# Patient Record
Sex: Female | Born: 1985 | Race: White | Hispanic: No | Marital: Single | Smoking: Former smoker
Health system: Southern US, Community
[De-identification: ages and names within clinical notes are randomized; demographics above are authoritative.]

## PROBLEM LIST (undated history)

## (undated) ENCOUNTER — Inpatient Hospital Stay: Payer: Self-pay

## (undated) DIAGNOSIS — F319 Bipolar disorder, unspecified: Secondary | ICD-10-CM

## (undated) DIAGNOSIS — J45909 Unspecified asthma, uncomplicated: Secondary | ICD-10-CM

## (undated) HISTORY — PX: WISDOM TOOTH EXTRACTION: SHX21

---
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
## 2006-09-08 ENCOUNTER — Emergency Department: Payer: Self-pay | Admitting: Emergency Medicine

## 2006-12-22 ENCOUNTER — Other Ambulatory Visit: Admission: RE | Admit: 2006-12-22 | Discharge: 2006-12-22 | Payer: Self-pay | Admitting: Obstetrics and Gynecology

## 2007-06-27 ENCOUNTER — Inpatient Hospital Stay (HOSPITAL_COMMUNITY): Admission: AD | Admit: 2007-06-27 | Discharge: 2007-06-27 | Payer: Self-pay | Admitting: Obstetrics and Gynecology

## 2007-07-09 ENCOUNTER — Inpatient Hospital Stay (HOSPITAL_COMMUNITY): Admission: AD | Admit: 2007-07-09 | Discharge: 2007-07-09 | Payer: Self-pay | Admitting: Obstetrics and Gynecology

## 2007-07-11 ENCOUNTER — Inpatient Hospital Stay (HOSPITAL_COMMUNITY): Admission: AD | Admit: 2007-07-11 | Discharge: 2007-07-14 | Payer: Self-pay | Admitting: Obstetrics and Gynecology

## 2008-02-26 ENCOUNTER — Other Ambulatory Visit: Admission: RE | Admit: 2008-02-26 | Discharge: 2008-02-26 | Payer: Self-pay | Admitting: Obstetrics and Gynecology

## 2009-06-11 ENCOUNTER — Emergency Department (HOSPITAL_BASED_OUTPATIENT_CLINIC_OR_DEPARTMENT_OTHER): Admission: EM | Admit: 2009-06-11 | Discharge: 2009-06-11 | Payer: Self-pay | Admitting: Emergency Medicine

## 2009-06-11 ENCOUNTER — Ambulatory Visit: Payer: Self-pay | Admitting: Radiology

## 2009-09-25 ENCOUNTER — Other Ambulatory Visit: Admission: RE | Admit: 2009-09-25 | Discharge: 2009-09-25 | Payer: Self-pay | Admitting: Obstetrics and Gynecology

## 2010-10-13 LAB — LIPASE, BLOOD: Lipase: 28 U/L (ref 23–300)

## 2010-10-13 LAB — COMPREHENSIVE METABOLIC PANEL
ALT: 22 U/L (ref 0–35)
Alkaline Phosphatase: 88 U/L (ref 39–117)
BUN: 7 mg/dL (ref 6–23)
CO2: 26 mEq/L (ref 19–32)
Chloride: 105 mEq/L (ref 96–112)
GFR calc non Af Amer: >60 mL/min (ref >60)
Glucose, Bld: 101 mg/dL — ABNORMAL HIGH (ref 70–99)
Potassium: 4 mEq/L (ref 3.5–5.1)
Sodium: 145 mEq/L (ref 135–145)
Total Bilirubin: 1.1 mg/dL (ref 0.3–1.2)
Total Protein: 7.9 g/dL (ref 6.0–8.3)

## 2010-10-13 LAB — URINALYSIS, ROUTINE W REFLEX MICROSCOPIC
Bilirubin Urine: NEGATIVE
Glucose, UA: NEGATIVE mg/dL
Hgb urine dipstick: NEGATIVE
Ketones, ur: NEGATIVE mg/dL
Nitrite: NEGATIVE
Protein, ur: NEGATIVE mg/dL
Specific Gravity, Urine: 1.018 (ref 1.005–1.030)
Urobilinogen, UA: 0.2 mg/dL (ref 0.0–1.0)
pH: 7 (ref 5.0–8.0)

## 2010-10-13 LAB — DIFFERENTIAL
Basophils Absolute: 0 10*3/uL (ref 0.0–0.1)
Basophils Relative: 1 % (ref 0–1)
Eosinophils Absolute: 0.2 10*3/uL (ref 0.0–0.7)
Neutro Abs: 6.8 10*3/uL (ref 1.7–7.7)
Neutrophils Relative %: 75 % (ref 43–77)

## 2010-10-13 LAB — CBC
HCT: 37.5 % (ref 36.0–46.0)
Hemoglobin: 12.7 g/dL (ref 12.0–15.0)
RBC: 4.42 MIL/uL (ref 3.87–5.11)
RDW: 13 % (ref 11.5–15.5)

## 2010-10-13 LAB — GC/CHLAMYDIA PROBE AMP, GENITAL
Chlamydia, DNA Probe: POSITIVE — AB
GC Probe Amp, Genital: NEGATIVE

## 2010-10-13 LAB — URINE MICROSCOPIC-ADD ON

## 2010-10-13 LAB — PREGNANCY, URINE: Preg Test, Ur: NEGATIVE

## 2010-10-13 LAB — WET PREP, GENITAL: Clue Cells Wet Prep HPF POC: NONE SEEN

## 2011-04-01 LAB — BASIC METABOLIC PANEL
BUN: <1 — ABNORMAL LOW
Calcium: 8 — ABNORMAL LOW
Chloride: 102
Chloride: 104
Creatinine, Ser: 0.44
Creatinine, Ser: 0.54
GFR calc Af Amer: >60
GFR calc Af Amer: >60
GFR calc non Af Amer: >60
Potassium: 3.1 — ABNORMAL LOW
Sodium: 138

## 2011-04-01 LAB — RH IMMUNE GLOB WKUP(>/=20WKS)(NOT WOMEN'S HOSP): Fetal Screen: NEGATIVE

## 2011-04-01 LAB — CBC
HCT: 26.9 — ABNORMAL LOW
Hemoglobin: 9.4 — ABNORMAL LOW
MCHC: 35
RBC: 3.04 — ABNORMAL LOW
RDW: 13.9

## 2011-04-16 LAB — CBC
HCT: 31.2 — ABNORMAL LOW
Hemoglobin: 10.8 — ABNORMAL LOW
MCHC: 35
MCV: 88.8
Platelets: 262
RDW: 13.4
RDW: 14
WBC: 11.3 — ABNORMAL HIGH

## 2011-04-16 LAB — LACTATE DEHYDROGENASE: LDH: 163

## 2011-04-16 LAB — URINALYSIS, ROUTINE W REFLEX MICROSCOPIC
Nitrite: NEGATIVE
Protein, ur: NEGATIVE
Specific Gravity, Urine: 1.01
Urobilinogen, UA: 0.2

## 2011-04-16 LAB — COMPREHENSIVE METABOLIC PANEL
ALT: 14
Albumin: 2.5 — ABNORMAL LOW
Albumin: 2.5 — ABNORMAL LOW
Alkaline Phosphatase: 153 — ABNORMAL HIGH
BUN: 1 — ABNORMAL LOW
Calcium: 9.1
Chloride: 104
Chloride: 105
Creatinine, Ser: 0.45
GFR calc Af Amer: >60
Glucose, Bld: 98
Potassium: 2.7 — CL
Sodium: 138
Total Bilirubin: 0.9
Total Protein: 5.4 — ABNORMAL LOW
Total Protein: 5.4 — ABNORMAL LOW

## 2011-04-16 LAB — URINE MICROSCOPIC-ADD ON

## 2011-04-16 LAB — URINALYSIS, DIPSTICK ONLY
Bilirubin Urine: NEGATIVE
Glucose, UA: NEGATIVE
Ketones, ur: NEGATIVE
pH: 7

## 2011-04-16 LAB — RPR: RPR Ser Ql: NONREACTIVE

## 2011-04-16 LAB — URIC ACID: Uric Acid, Serum: 3.3

## 2012-04-17 ENCOUNTER — Other Ambulatory Visit: Payer: Self-pay | Admitting: Obstetrics and Gynecology

## 2012-04-17 ENCOUNTER — Other Ambulatory Visit (HOSPITAL_COMMUNITY)
Admission: RE | Admit: 2012-04-17 | Discharge: 2012-04-17 | Disposition: A | Discharge status: Home / Self Care | Payer: 59 | Source: Ambulatory Visit | Attending: Obstetrics and Gynecology | Admitting: Obstetrics and Gynecology

## 2012-04-17 DIAGNOSIS — N760 Acute vaginitis: Secondary | ICD-10-CM | POA: Insufficient documentation

## 2012-04-17 DIAGNOSIS — Z113 Encounter for screening for infections with a predominantly sexual mode of transmission: Secondary | ICD-10-CM | POA: Insufficient documentation

## 2012-04-17 DIAGNOSIS — Z01419 Encounter for gynecological examination (general) (routine) without abnormal findings: Secondary | ICD-10-CM | POA: Insufficient documentation

## 2013-05-27 ENCOUNTER — Emergency Department: Payer: Self-pay | Admitting: Internal Medicine

## 2013-05-27 LAB — URINALYSIS, COMPLETE
Bilirubin,UR: NEGATIVE
Glucose,UR: NEGATIVE mg/dL (ref 0–75)
Leukocyte Esterase: NEGATIVE
Nitrite: NEGATIVE
Protein: NEGATIVE
RBC,UR: <1 /HPF (ref 0–5)
Specific Gravity: 1.02 (ref 1.003–1.030)
Squamous Epithelial: 1
WBC UR: 2 /HPF (ref 0–5)

## 2013-05-27 LAB — COMPREHENSIVE METABOLIC PANEL
Anion Gap: 4 — ABNORMAL LOW (ref 7–16)
Bilirubin,Total: 0.8 mg/dL (ref 0.2–1.0)
Chloride: 106 mmol/L (ref 98–107)
Co2: 27 mmol/L (ref 21–32)
Creatinine: 0.85 mg/dL (ref 0.60–1.30)
EGFR (African American): >60
Glucose: 86 mg/dL (ref 65–99)
SGOT(AST): 25 U/L (ref 15–37)
SGPT (ALT): 30 U/L (ref 12–78)
Total Protein: 7.3 g/dL (ref 6.4–8.2)

## 2013-05-27 LAB — CBC
HGB: 11.4 g/dL — ABNORMAL LOW (ref 12.0–16.0)
MCHC: 33.4 g/dL (ref 32.0–36.0)
RBC: 4.14 10*6/uL (ref 3.80–5.20)
WBC: 11.9 10*3/uL — ABNORMAL HIGH (ref 3.6–11.0)

## 2013-05-27 LAB — GC/CHLAMYDIA PROBE AMP

## 2013-05-27 LAB — HCG, QUANTITATIVE, PREGNANCY: Beta Hcg, Quant.: <1 m[IU]/mL — ABNORMAL LOW

## 2013-09-09 ENCOUNTER — Emergency Department: Payer: Self-pay | Admitting: Emergency Medicine

## 2014-03-22 ENCOUNTER — Emergency Department: Payer: Self-pay | Admitting: Emergency Medicine

## 2014-03-22 LAB — COMPREHENSIVE METABOLIC PANEL
ALBUMIN: 4.1 g/dL (ref 3.4–5.0)
ALK PHOS: 90 U/L
ALT: 18 U/L
Anion Gap: 9 (ref 7–16)
BILIRUBIN TOTAL: 0.7 mg/dL (ref 0.2–1.0)
BUN: 8 mg/dL (ref 7–18)
CHLORIDE: 107 mmol/L (ref 98–107)
CO2: 24 mmol/L (ref 21–32)
Calcium, Total: 8.9 mg/dL (ref 8.5–10.1)
Creatinine: 0.68 mg/dL (ref 0.60–1.30)
GLUCOSE: 125 mg/dL — AB (ref 65–99)
Osmolality: 279 (ref 275–301)
POTASSIUM: 3.5 mmol/L (ref 3.5–5.1)
SGOT(AST): 26 U/L (ref 15–37)
Sodium: 140 mmol/L (ref 136–145)
Total Protein: 7.9 g/dL (ref 6.4–8.2)

## 2014-03-22 LAB — DRUG SCREEN, URINE
Amphetamines, Ur Screen: NEGATIVE (ref ?–1000)
BARBITURATES, UR SCREEN: NEGATIVE (ref ?–200)
Benzodiazepine, Ur Scrn: NEGATIVE (ref ?–200)
Cannabinoid 50 Ng, Ur ~~LOC~~: POSITIVE (ref ?–50)
Cocaine Metabolite,Ur ~~LOC~~: POSITIVE (ref ?–300)
MDMA (Ecstasy)Ur Screen: NEGATIVE (ref ?–500)
Methadone, Ur Screen: NEGATIVE (ref ?–300)
Opiate, Ur Screen: POSITIVE (ref ?–300)
Phencyclidine (PCP) Ur S: NEGATIVE (ref ?–25)
TRICYCLIC, UR SCREEN: NEGATIVE (ref ?–1000)

## 2014-03-22 LAB — URINALYSIS, COMPLETE
BILIRUBIN, UR: NEGATIVE
Blood: NEGATIVE
Glucose,UR: NEGATIVE mg/dL (ref 0–75)
Ketone: NEGATIVE
Leukocyte Esterase: NEGATIVE
Nitrite: NEGATIVE
PROTEIN: NEGATIVE
Ph: 7 (ref 4.5–8.0)
RBC, UR: NONE SEEN /HPF (ref 0–5)
SPECIFIC GRAVITY: 1.002 (ref 1.003–1.030)
Squamous Epithelial: 1
WBC UR: 2 /HPF (ref 0–5)

## 2014-03-22 LAB — CBC
HCT: 42.2 % (ref 35.0–47.0)
HGB: 13.8 g/dL (ref 12.0–16.0)
MCH: 28.6 pg (ref 26.0–34.0)
MCHC: 32.7 g/dL (ref 32.0–36.0)
MCV: 88 fL (ref 80–100)
PLATELETS: 299 10*3/uL (ref 150–440)
RBC: 4.81 10*6/uL (ref 3.80–5.20)
RDW: 15.9 % — AB (ref 11.5–14.5)
WBC: 13.3 10*3/uL — AB (ref 3.6–11.0)

## 2014-03-22 LAB — ACETAMINOPHEN LEVEL: Acetaminophen: <2 ug/mL

## 2014-03-22 LAB — TSH: THYROID STIMULATING HORM: 0.749 u[IU]/mL

## 2014-03-22 LAB — ETHANOL: Ethanol: <3 mg/dL

## 2014-03-22 LAB — SALICYLATE LEVEL: Salicylates, Serum: 2.2 mg/dL

## 2015-01-14 ENCOUNTER — Encounter: Payer: Self-pay | Admitting: *Deleted

## 2015-01-14 ENCOUNTER — Emergency Department
Admission: EM | Admit: 2015-01-14 | Discharge: 2015-01-14 | Disposition: A | Discharge status: Home / Self Care | Payer: Self-pay | Attending: Emergency Medicine | Admitting: Emergency Medicine

## 2015-01-14 ENCOUNTER — Emergency Department: Payer: Self-pay

## 2015-01-14 DIAGNOSIS — Y9289 Other specified places as the place of occurrence of the external cause: Secondary | ICD-10-CM | POA: Insufficient documentation

## 2015-01-14 DIAGNOSIS — S42031A Displaced fracture of lateral end of right clavicle, initial encounter for closed fracture: Secondary | ICD-10-CM | POA: Insufficient documentation

## 2015-01-14 DIAGNOSIS — F319 Bipolar disorder, unspecified: Secondary | ICD-10-CM | POA: Insufficient documentation

## 2015-01-14 DIAGNOSIS — Y9389 Activity, other specified: Secondary | ICD-10-CM | POA: Insufficient documentation

## 2015-01-14 DIAGNOSIS — Z720 Tobacco use: Secondary | ICD-10-CM | POA: Insufficient documentation

## 2015-01-14 DIAGNOSIS — Z3202 Encounter for pregnancy test, result negative: Secondary | ICD-10-CM | POA: Insufficient documentation

## 2015-01-14 DIAGNOSIS — Y998 Other external cause status: Secondary | ICD-10-CM | POA: Insufficient documentation

## 2015-01-14 HISTORY — DX: Bipolar disorder, unspecified: F31.9

## 2015-01-14 LAB — URINALYSIS COMPLETE WITH MICROSCOPIC (ARMC ONLY)
Bilirubin Urine: NEGATIVE
Glucose, UA: NEGATIVE mg/dL
HGB URINE DIPSTICK: NEGATIVE
KETONES UR: NEGATIVE mg/dL
Leukocytes, UA: NEGATIVE
Nitrite: NEGATIVE
PH: 5 (ref 5.0–8.0)
Protein, ur: 30 mg/dL — AB
SPECIFIC GRAVITY, URINE: 1.023 (ref 1.005–1.030)

## 2015-01-14 LAB — POCT PREGNANCY, URINE: PREG TEST UR: NEGATIVE

## 2015-01-14 MED ORDER — OXYCODONE-ACETAMINOPHEN 5-325 MG PO TABS
ORAL_TABLET | ORAL | Status: AC
  Administered 2015-01-14: 2 via ORAL
  Filled 2015-01-14: qty 2

## 2015-01-14 MED ORDER — OXYCODONE-ACETAMINOPHEN 5-325 MG PO TABS
2.0000 | ORAL_TABLET | Freq: Once | ORAL | Status: AC
  Administered 2015-01-14: 2 via ORAL

## 2015-01-14 MED ORDER — HYDROCODONE-ACETAMINOPHEN 5-325 MG PO TABS: 1.0000 | ORAL_TABLET | ORAL | Status: DC | PRN

## 2015-01-14 NOTE — ED Notes (Signed)
Patient transported to X-ray 
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
## 2015-01-14 NOTE — Discharge Instructions (Signed)
Take Norco as prescribed for severe pain. Do not drink alcohol, drive or participate in any other potentially dangerous activities while taking this medication as it may make you sleepy. Do not take this medication with any other sedating medications, either prescription or over-the-counter. If you were prescribed Percocet or Vicodin, do not take these with acetaminophen (Tylenol) as it is already contained within these medications.   This medication is an opiate (or narcotic) pain medication and can be habit forming.  Use it as little as possible to achieve adequate pain control.  Do not use or use it with extreme caution if you have a history of opiate abuse or dependence.  If you are on a pain contract with your primary care doctor or a pain specialist, be sure to let them know you were prescribed this medication today from the [facility].  This medication is intended for your use only - do not give any to anyone else and keep it in a secure place where nobody else, especially children, have access to it.  It will also cause or worsen constipation, so you may want to consider taking an over-the-counter stool softener while you are taking this medication.    Clavicle Fracture The clavicle, also called the collarbone, is the long bone that connects your shoulder to your rib cage. You can feel your collarbone at the top of your shoulders and rib cage. A clavicle fracture is a broken clavicle. It is a common injury that can happen at any age.  CAUSES Common causes of a clavicle fracture include:  A direct blow to your shoulder.  A car accident.  A fall, especially if you try to break your fall with an outstretched arm. RISK FACTORS You may be at increased risk if:  You are younger than 25 years or older than 75 years. Most clavicle fractures happen to people who are younger than 25 years.  You are a female.  You play contact sports. SIGNS AND SYMPTOMS A fractured  clavicle is painful. It also makes it hard to move your arm. Other signs and symptoms may include:  A shoulder that drops downward and forward.  Pain when trying to lift your shoulder.  Bruising, swelling, and tenderness over your clavicle.  A grinding noise when you try to move your shoulder.  A bump over your clavicle. DIAGNOSIS Your health care provider can usually diagnose a clavicle fracture by asking about your injury and examining your shoulder and clavicle. He or she may take an X-ray to determine the position of your clavicle. TREATMENT Treatment depends on the position of your clavicle after the fracture:  If the broken ends of the bone are not out of place, your health care provider may put your arm in a sling or wrap a support bandage around your chest (figure-of-eight wrap).  If the broken ends of the bone are out of place, you may need surgery. Surgery may involve placing screws, pins, or plates to keep your clavicle stable while it heals. Healing may take about 3 months. When your health care provider thinks your fracture has healed enough, you may have to do physical therapy to regain normal movement and build up your arm strength. HOME CARE INSTRUCTIONS   Apply ice to the injured area:  Put ice in a plastic bag.  Place a towel between your skin and the bag.  Leave the ice on for 20 minutes, 2-3 times a day.  If you have a wrap or  splint:  Wear it all the time, and remove it only to take a bath or shower.  When you bathe or shower, keep your shoulder in the same position as when the sling or wrap is on.  Do not lift your arm.  If you have a figure-of-eight wrap:  Another person must tighten it every day.  It should be tight enough to hold your shoulders back.  Allow enough room to place your index finger between your body and the strap.  Loosen the wrap immediately if you feel numbness or tingling in your hands.  Only take medicines as directed by your  health care provider.  Avoid activities that make the injury or pain worse for 4-6 weeks after surgery.  Keep all follow-up appointments. SEEK MEDICAL CARE IF:  Your medicine is not helping to relieve pain and swelling. SEEK IMMEDIATE MEDICAL CARE IF:  Your arm is numb, cold, or pale, even when the splint is loose. MAKE SURE YOU:   Understand these instructions.  Will watch your condition.  Will get help right away if you are not doing well or get worse. Document Released: 04/07/2005 Document Revised: 07/03/2013 Document Reviewed: 05/21/2013 [facility] Patient Information 2015 [place], [place]. This information is not intended to replace advice given to you by your health care provider. Make sure you discuss any questions you have with your health care provider.

## 2015-11-16 ENCOUNTER — Encounter: Payer: Self-pay | Admitting: *Deleted

## 2015-11-16 ENCOUNTER — Emergency Department
Admission: EM | Admit: 2015-11-16 | Discharge: 2015-11-16 | Disposition: A | Discharge status: Home / Self Care | Payer: Self-pay | Attending: Emergency Medicine | Admitting: Emergency Medicine

## 2015-11-16 DIAGNOSIS — K0889 Other specified disorders of teeth and supporting structures: Secondary | ICD-10-CM | POA: Insufficient documentation

## 2015-11-16 DIAGNOSIS — H66003 Acute suppurative otitis media without spontaneous rupture of ear drum, bilateral: Secondary | ICD-10-CM | POA: Insufficient documentation

## 2015-11-16 DIAGNOSIS — F1290 Cannabis use, unspecified, uncomplicated: Secondary | ICD-10-CM | POA: Insufficient documentation

## 2015-11-16 DIAGNOSIS — F319 Bipolar disorder, unspecified: Secondary | ICD-10-CM | POA: Insufficient documentation

## 2015-11-16 DIAGNOSIS — F17200 Nicotine dependence, unspecified, uncomplicated: Secondary | ICD-10-CM | POA: Insufficient documentation

## 2015-11-16 DIAGNOSIS — J069 Acute upper respiratory infection, unspecified: Secondary | ICD-10-CM | POA: Insufficient documentation

## 2015-11-16 MED ORDER — AMOXICILLIN 500 MG PO CAPS
1000.0000 mg | ORAL_CAPSULE | Freq: Once | ORAL | Status: AC
  Administered 2015-11-16: 1000 mg via ORAL

## 2015-11-16 MED ORDER — PREDNISONE 20 MG PO TABS
ORAL_TABLET | ORAL | Status: AC
Start: 2015-11-16 — End: 2015-11-16
  Administered 2015-11-16: 40 mg via ORAL
  Filled 2015-11-16: qty 2

## 2015-11-16 MED ORDER — AMOXICILLIN 875 MG PO TABS: 875.0000 mg | ORAL_TABLET | Freq: Two times a day (BID) | ORAL | Status: DC

## 2015-11-16 MED ORDER — PREDNISONE 20 MG PO TABS
40.0000 mg | ORAL_TABLET | ORAL | Status: AC
  Administered 2015-11-16: 40 mg via ORAL

## 2015-11-16 MED ORDER — AMOXICILLIN 500 MG PO CAPS
ORAL_CAPSULE | ORAL | Status: AC
  Administered 2015-11-16: 1000 mg via ORAL
  Filled 2015-11-16: qty 2

## 2015-11-16 MED ORDER — PREDNISONE 20 MG PO TABS: 20.0000 mg | ORAL_TABLET | Freq: Every day | ORAL | Status: DC

## 2015-11-16 NOTE — ED Provider Notes (Signed)
[facility] Emergency Department Provider Note  ____________________________________________  Time seen: 10:20 PM  I have reviewed the triage vital signs and the nursing notes.   HISTORY  Chief Complaint URI    HPI Cori Justus Pinkstaff is a 30 y.o. female who complains of nonproductive cough, sore throat, right submandibular swelling, bilateral ear pain for the past 3 or 4 days which is worsening. Denies nausea vomiting. Normal oral intake. No headache vision changes numbness tingling weakness dizziness or syncope. No chest pain or shortness of breath. Positive fatigue     Past Medical History  Diagnosis Date  . Bipolar 1 disorder [facility])      Patient Active Problem List   Diagnosis Date Noted  . Bipolar 1 disorder (HCC)      History reviewed. No pertinent past surgical history.   Current Outpatient Rx  Name  Route  Sig  Dispense  Refill  . amoxicillin (AMOXIL) 875 MG tablet   Oral   Take 1 tablet (875 mg total) by mouth 2 (two) times daily.   14 tablet   0   . HYDROcodone-acetaminophen (NORCO/VICODIN) 5-325 MG per tablet   Oral   Take 1-2 tablets by mouth every 4 (four) hours as needed for moderate pain.   30 tablet   0   . predniSONE (DELTASONE) 20 MG tablet   Oral   Take 1 tablet (20 mg total) by mouth daily.   4 tablet   0      Allergies Review of patient's allergies indicates no known allergies.   History reviewed. No pertinent family history.  Social History Social History  Substance Use Topics  . Smoking status: Current Every Day Smoker  . Smokeless tobacco: None  . Alcohol Use: Yes    Review of Systems  Constitutional:   No fever or chills.  Eyes:   No vision changes.  ENT:   Positive sore throat and rhinorrhea. Positive dental pain in the area of left lower first molar Cardiovascular:   No chest pain. Respiratory:   No dyspnea positive nonproductive cough. Gastrointestinal:   Negative for abdominal pain,  vomiting and diarrhea.  Genitourinary:   Negative for dysuria or difficulty urinating. Musculoskeletal:   Negative for focal pain or swelling Neurological:   Negative for headaches 10-point ROS otherwise negative.  ____________________________________________   PHYSICAL EXAM:  VITAL SIGNS: ED Triage Vitals  Enc Vitals Group     BP 11/16/15 2212 132/84 mmHg     Pulse Rate 11/16/15 2212 127     Resp 11/16/15 2212 20     Temp 11/16/15 2212 98.3 F (36.8 C)     Temp Source 11/16/15 2212 Oral     SpO2 11/16/15 2212 99 %     Weight 11/16/15 2212 175 lb (79.379 kg)     Height 11/16/15 2212  (1.575 m)     Head Cir --      Peak Flow --      Pain Score 11/16/15 2210 9     Pain Loc --      Pain Edu? --      Excl. in GC? --     Vital signs reviewed, nursing assessments reviewed.   Constitutional:   Alert and oriented. Well appearing and in no distress.Talkative and energetic Eyes:   No scleral icterus. No conjunctival pallor. PERRL. EOMI.  No nystagmus. ENT   Head:   Normocephalic and atraumatic. Bilateral TMs are erythematous and bulging. No perforation.   Nose:  Positive nasal congestion. No septal hematoma   Mouth/Throat:   MMM, positive pharyngeal erythema without posterior swelling or peritonsillar mass. Overall teeth are in good shape, but area of indicated dental pain to show that there is a first molar with a large defect in the bite surface. Patient reports she had a filling come out of the tooth, and this is consistent with that. No gingival swelling or fluctuance or drainage. Tooth is stable in the socket. No fracture   Neck:   No stridor. No SubQ emphysema. No meningismus. Hematological/Lymphatic/Immunilogical:   1-2 cm right submandibular lymph node which is tender to the touch and mobile. Cardiovascular:   Tachycardia heart rate 105. Symmetric bilateral radial and DP pulses.  No murmurs.  Respiratory:   Normal respiratory effort without tachypnea nor  retractions. Breath sounds are clear and equal bilaterally. No wheezes/rales/rhonchi. No inducible wheezing with forceful expiration Gastrointestinal:   Soft and nontender. Non distended. There is no CVA tenderness.  No rebound, rigidity, or guarding. Genitourinary:   deferred Musculoskeletal:   Nontender with normal range of motion in all extremities. No joint effusions.  No lower extremity tenderness.  No edema. Neurologic:   Normal speech and language.  CN 2-10 normal. Motor grossly intact. No gross focal neurologic deficits are appreciated.    ____________________________________________    LABS (pertinent positives/negatives) (all labs ordered are listed, but only abnormal results are displayed) Labs Reviewed - No data to display ____________________________________________   EKG    ____________________________________________    RADIOLOGY    ____________________________________________   PROCEDURES   ____________________________________________   INITIAL IMPRESSION / ASSESSMENT AND PLAN / ED COURSE  Pertinent labs & imaging results that were available during my care of the patient were reviewed by me and considered in my medical decision making (see chart for details).  Patient well appearing no acute distress, presents with multiple symptoms consistent with upper respiratory infection, most likely viral. However, with her bilateral otitis media and lymphadenopathy and slight tachycardia, I started the patient on amoxicillin and prednisone to help treat and shortened her symptoms. I do not think she is septic, no evidence of PTA or RPA, no evidence of meningitis encephalitis. Patient is otherwise well-appearing nontoxic no distress, and I'll write prescriptions and given a dose here in the emergency department and have her follow up with primary care. She also states she'll call dentistry tomorrow to address the dental  pain.     ____________________________________________   FINAL CLINICAL IMPRESSION(S) / ED DIAGNOSES  Final diagnoses:  Pain, dental  Acute suppurative otitis media of both ears without spontaneous rupture of tympanic membranes, recurrence not specified  Upper respiratory infection       Portions of this note were generated with dragon dictation software. Dictation errors may occur despite best attempts at proofreading.   Sharman CheekPhillip Doyle Tegethoff, MD 11/16/15 2229

## 2015-11-16 NOTE — ED Notes (Signed)
Pt c/o cold sxs x 1 month. Pt c/o dental pain starting this afternoon.

## 2015-11-16 NOTE — Discharge Instructions (Signed)
Cough, Adult A cough helps to clear your throat and lungs. A cough may last only 2-3 weeks (acute), or it may last longer than 8 weeks (chronic). Many different things can cause a cough. A cough may be a sign of an illness or another medical condition. HOME CARE  Pay attention to any changes in your cough.  Take medicines only as told by your doctor.  If you were prescribed an antibiotic medicine, take it as told by your doctor. Do not stop taking it even if you start to feel better.  Talk with your doctor before you try using a cough medicine.  Drink enough fluid to keep your pee (urine) clear or pale yellow.  If the air is dry, use a cold steam vaporizer or humidifier in your home.  Stay away from things that make you cough at work or at home.  If your cough is worse at night, try using extra pillows to raise your head up higher while you sleep.  Do not smoke, and try not to be around smoke. If you need help quitting, ask your doctor.  Do not have caffeine.  Do not drink alcohol.  Rest as needed. GET HELP IF:  You have new problems (symptoms).  You cough up yellow fluid (pus).  Your cough does not get better after 2-3 weeks, or your cough gets worse.  Medicine does not help your cough and you are not sleeping well.  You have pain that gets worse or pain that is not helped with medicine.  You have a fever.  You are losing weight and you do not know why.  You have night sweats. GET HELP RIGHT AWAY IF:  You cough up blood.  You have trouble breathing.  Your heartbeat is very fast.   This information is not intended to replace advice given to you by your health care provider. Make sure you discuss any questions you have with your health care provider.   Document Released: 03/11/2011 Document Revised: 03/19/2015 Document Reviewed: 09/04/2014 Elsevier Interactive Patient Education 2016 Elsevier Inc.  Otitis Media, Adult Otitis media is redness, soreness, and  inflammation of the middle ear. Otitis media may be caused by allergies or, most commonly, by infection. Often it occurs as a complication of the common cold. SIGNS AND SYMPTOMS Symptoms of otitis media may include:  Earache.  Fever.  Ringing in your ear.  Headache.  Leakage of fluid from the ear. DIAGNOSIS To diagnose otitis media, your health care provider will examine your ear with an otoscope. This is an instrument that allows your health care provider to see into your ear in order to examine your eardrum. Your health care provider also will ask you questions about your symptoms. TREATMENT  Typically, otitis media resolves on its own within 3-5 days. Your health care provider may prescribe medicine to ease your symptoms of pain. If otitis media does not resolve within 5 days or is recurrent, your health care provider may prescribe antibiotic medicines if he or she suspects that a bacterial infection is the cause. HOME CARE INSTRUCTIONS   If you were prescribed an antibiotic medicine, finish it all even if you start to feel better.  Take medicines only as directed by your health care provider.  Keep all follow-up visits as directed by your health care provider. SEEK MEDICAL CARE IF:  You have otitis media only in one ear, or bleeding from your nose, or both.  You notice a lump on your neck.  You  are not getting better in 3-5 days. °· You feel worse instead of better. °SEEK IMMEDIATE MEDICAL CARE IF:  °· You have pain that is not controlled with medicine. °· You have swelling, redness, or pain around your ear or stiffness in your neck. °· You notice that part of your face is paralyzed. °· You notice that the bone behind your ear (mastoid) is tender when you touch it. °MAKE SURE YOU:  °· Understand these instructions. °· Will watch your condition. °· Will get help right away if you are not doing well or get worse. °  °This information is not intended to replace advice given to you by  your health care provider. Make sure you discuss any questions you have with your health care provider. °  °Document Released: 04/02/2004 Document Revised: 07/19/2014 Document Reviewed: 01/23/2013 °Elsevier Interactive Patient Education ©2016 Elsevier Inc. ° °Upper Respiratory Infection, Adult °Most upper respiratory infections (URIs) are a viral infection of the air passages leading to the lungs. A URI affects the nose, throat, and upper air passages. The most common type of URI is nasopharyngitis and is typically referred to as "the common cold." °URIs run their course and usually go away on their own. Most of the time, a URI does not require medical attention, but sometimes a bacterial infection in the upper airways can follow a viral infection. This is called a secondary infection. Sinus and middle ear infections are common types of secondary upper respiratory infections. °Bacterial pneumonia can also complicate a URI. A URI can worsen asthma and chronic obstructive pulmonary disease (COPD). Sometimes, these complications can require emergency medical care and may be life threatening.  °CAUSES °Almost all URIs are caused by viruses. A virus is a type of germ and can spread from one person to another.  °RISKS FACTORS °You may be at risk for a URI if:  °· You smoke.   °· You have chronic heart or lung disease. °· You have a weakened defense (immune) system.   °· You are very young or very old.   °· You have nasal allergies or asthma. °· You work in crowded or poorly ventilated areas. °· You work in health care facilities or schools. °SIGNS AND SYMPTOMS  °Symptoms typically develop 2-3 days after you come in contact with a cold virus. Most viral URIs last 7-10 days. However, viral URIs from the influenza virus (flu virus) can last 14-18 days and are typically more severe. Symptoms may include:  °· Runny or stuffy (congested) nose.   °· Sneezing.   °· Cough.   °· Sore throat.   °· Headache.   °· Fatigue.   °· Fever.    °· Loss of appetite.   °· Pain in your forehead, behind your eyes, and over your cheekbones (sinus pain). °· Muscle aches.   °DIAGNOSIS  °Your health care provider may diagnose a URI by: °· Physical exam. °· Tests to check that your symptoms are not due to another condition such as: °¨ Strep throat. °¨ Sinusitis. °¨ Pneumonia. °¨ Asthma. °TREATMENT  °A URI goes away on its own with time. It cannot be cured with medicines, but medicines may be prescribed or recommended to relieve symptoms. Medicines may help: °· Reduce your fever. °· Reduce your cough. °· Relieve nasal congestion. °HOME CARE INSTRUCTIONS  °· Take medicines only as directed by your health care provider.   °· Gargle warm saltwater or take cough drops to comfort your throat as directed by your health care provider. °· Use a warm mist humidifier or inhale steam from a shower to   increase air moisture. This may make it easier to breathe.  Drink enough fluid to keep your urine clear or pale yellow.   Eat soups and other clear broths and maintain good nutrition.   Rest as needed.   Return to work when your temperature has returned to normal or as your health care provider advises. You may need to stay home longer to avoid infecting others. You can also use a face mask and careful hand washing to prevent spread of the virus.  Increase the usage of your inhaler if you have asthma.   Do not use any tobacco products, including cigarettes, chewing tobacco, or electronic cigarettes. If you need help quitting, ask your health care provider. PREVENTION  The best way to protect yourself from getting a cold is to practice good hygiene.   Avoid oral or hand contact with people with cold symptoms.   Wash your hands often if contact occurs.  There is no clear evidence that vitamin C, vitamin E, echinacea, or exercise reduces the chance of developing a cold. However, it is always recommended to get plenty of rest, exercise, and practice good  nutrition.  SEEK MEDICAL CARE IF:   You are getting worse rather than better.   Your symptoms are not controlled by medicine.   You have chills.  You have worsening shortness of breath.  You have brown or red mucus.  You have yellow or brown nasal discharge.  You have pain in your face, especially when you bend forward.  You have a fever.  You have swollen neck glands.  You have pain while swallowing.  You have white areas in the back of your throat. SEEK IMMEDIATE MEDICAL CARE IF:   You have severe or persistent:  Headache.  Ear pain.  Sinus pain.  Chest pain.  You have chronic lung disease and any of the following:  Wheezing.  Prolonged cough.  Coughing up blood.  A change in your usual mucus.  You have a stiff neck.  You have changes in your:  Vision.  Hearing.  Thinking.  Mood. MAKE SURE YOU:   Understand these instructions.  Will watch your condition.  Will get help right away if you are not doing well or get worse.   This information is not intended to replace advice given to you by your health care provider. Make sure you discuss any questions you have with your health care provider.   Document Released: 12/22/2000 Document Revised: 11/12/2014 Document Reviewed: 10/03/2013 Elsevier Interactive Patient Education [employer] Elsevier Inc.

## 2015-11-17 ENCOUNTER — Emergency Department
Admission: EM | Admit: 2015-11-17 | Discharge: 2015-11-17 | Disposition: A | Discharge status: Home / Self Care | Payer: Self-pay | Attending: Emergency Medicine | Admitting: Emergency Medicine

## 2015-11-17 ENCOUNTER — Encounter: Payer: Self-pay | Admitting: *Deleted

## 2015-11-17 DIAGNOSIS — F319 Bipolar disorder, unspecified: Secondary | ICD-10-CM | POA: Insufficient documentation

## 2015-11-17 DIAGNOSIS — F1210 Cannabis abuse, uncomplicated: Secondary | ICD-10-CM | POA: Insufficient documentation

## 2015-11-17 DIAGNOSIS — K047 Periapical abscess without sinus: Secondary | ICD-10-CM | POA: Insufficient documentation

## 2015-11-17 DIAGNOSIS — F17200 Nicotine dependence, unspecified, uncomplicated: Secondary | ICD-10-CM | POA: Insufficient documentation

## 2015-11-17 DIAGNOSIS — K0889 Other specified disorders of teeth and supporting structures: Secondary | ICD-10-CM | POA: Insufficient documentation

## 2015-11-17 MED ORDER — OXYCODONE-ACETAMINOPHEN 5-325 MG PO TABS
1.0000 | ORAL_TABLET | Freq: Once | ORAL | Status: AC
  Administered 2015-11-17: 1 via ORAL
  Filled 2015-11-17: qty 1

## 2015-11-17 MED ORDER — IBUPROFEN 800 MG PO TABS: 800.0000 mg | ORAL_TABLET | Freq: Three times a day (TID) | ORAL | Status: DC | PRN

## 2015-11-17 MED ORDER — LIDOCAINE VISCOUS 2 % MT SOLN
15.0000 mL | Freq: Once | OROMUCOSAL | Status: AC
  Administered 2015-11-17: 15 mL via OROMUCOSAL
  Filled 2015-11-17: qty 15

## 2015-11-17 MED ORDER — OXYCODONE-ACETAMINOPHEN 5-325 MG PO TABS: 1.0000 | ORAL_TABLET | ORAL | Status: DC | PRN

## 2015-11-17 NOTE — ED Notes (Signed)
Pt c/o no pain relief w/ OTC meds that the provider told her to take for her dental pain at home. Pt smells of marijuana at this time.

## 2015-11-17 NOTE — ED Notes (Signed)
MD at bedside. 

## 2015-11-17 NOTE — ED Notes (Signed)
Patient with complaint of left lower dental pain. Patient states that the she had a cavity that fell out about a month ago. Patient reports that she has developed a lot of pain to the tooth. Patient states that she was seen here early today and the pain has become worse.

## 2015-11-17 NOTE — Discharge Instructions (Signed)
1. Continue antibiotic as previously prescribed. 2. You may take pain medicines as needed (Motrin/Percocet #15). 3. Return to the ER for worsening symptoms, persistent vomiting, difficulty breathing or other concerns.  Dental Pain Dental pain may be caused by many things, including:  Tooth decay (cavities or caries). Cavities cause the nerve of your tooth to be open to air and hot or cold temperatures. This can cause pain or discomfort.  Abscess or infection. A dental abscess is an area that is full of infected pus from a bacterial infection in the inner part of the tooth (pulp). It usually happens at the end of the tooth's root.  Injury.  An unknown reason (idiopathic). Your pain may be mild or severe. It may only happen when:  You are chewing.  You are exposed to hot or cold temperature.  You are eating or drinking sugary foods or beverages, such as:  Soda.  Candy. Your pain may also be there all of the time. HOME CARE Watch your dental pain for any changes. Do these things to lessen your discomfort:  Take medicines only as told by your dentist.  If your dentist tells you to take an antibiotic medicine, finish all of it even if you start to feel better.  Keep all follow-up visits as told by your dentist. This is important.  Do not apply heat to the outside of your face.  Rinse your mouth or gargle with salt water if told by your dentist. This helps with pain and swelling.  You can make salt water by adding  tsp of salt to 1 cup of warm water.  Apply ice to the painful area of your face:  Put ice in a plastic bag.  Place a towel between your skin and the bag.  Leave the ice on for 20 minutes, 2-3 times per day.  Avoid foods or drinks that cause you pain, such as:  Very hot or very cold foods or drinks.  Sweet or sugary foods or drinks. GET HELP IF:  Your pain is not helped with medicines.  Your symptoms are worse.  You have new symptoms. GET HELP RIGHT  AWAY IF:  You cannot open your mouth.  You are having trouble breathing or swallowing.  You have a fever.  Your face, neck, or jaw is puffy (swollen).   This information is not intended to replace advice given to you by your health care provider. Make sure you discuss any questions you have with your health care provider.   Document Released: 12/15/2007 Document Revised: 11/12/2014 Document Reviewed: 06/24/2014 Elsevier Interactive Patient Education 2016 Elsevier Inc.  Dental Abscess A dental abscess is pus in or around a tooth. HOME CARE  Take medicines only as told by your dentist.  If you were prescribed antibiotic medicine, finish all of it even if you start to feel better.  Rinse your mouth (gargle) often with salt water.  Do not drive or use heavy machinery, like a lawn mower, while taking pain medicine.  Do not apply heat to the outside of your mouth.  Keep all follow-up visits as told by your dentist. This is important. GET HELP IF:  Your pain is worse, and medicine does not help. GET HELP RIGHT AWAY IF:  You have a fever or chills.  Your symptoms suddenly get worse.  You have a very bad headache.  You have problems breathing or swallowing.  You have trouble opening your mouth.  You have puffiness (swelling) in your neck or around  your eye.   This information is not intended to replace advice given to you by your health care provider. Make sure you discuss any questions you have with your health care provider.   Document Released: 11/12/2014 Document Reviewed: 11/12/2014 Elsevier Interactive Patient Education [employer]

## 2015-11-17 NOTE — ED Provider Notes (Signed)
[facility] Emergency Department Provider Note   ____________________________________________  Time seen: Approximately 5:34 AM  I have reviewed the triage vital signs and the nursing notes.   HISTORY  Chief Complaint Dental Pain    HPI Brandi Fleming is a 30 y.o. female who returns to the ED from home with a chief complain of dental pain. Patient was seen last evening for URI symptoms, prescribed amoxicillin and prednisone, and told to take Tylenol as needed for her dental pain. She returns secondary to persistent pain and inability to rest secondary to the pain. States her filling fell out from her left lower molar approximately 1 month ago. Presents with increasing pain 2 days. Denies associated fever, chills, chest pain, shortness of breath, abdominal pain, nausea, vomiting, diarrhea. Denies recent travel or trauma. She has tried Orajel without relief.   Past Medical History  Diagnosis Date  . Bipolar 1 disorder [facility](HCC)     Patient Active Problem List   Diagnosis Date Noted  . Bipolar 1 disorder (HCC)     History reviewed. No pertinent past surgical history.  Current Outpatient Rx  Name  Route  Sig  Dispense  Refill  . amoxicillin (AMOXIL) 875 MG tablet   Oral   Take 1 tablet (875 mg total) by mouth 2 (two) times daily.   14 tablet   0   . HYDROcodone-acetaminophen (NORCO/VICODIN) 5-325 MG per tablet   Oral   Take 1-2 tablets by mouth every 4 (four) hours as needed for moderate pain.   30 tablet   0   . predniSONE (DELTASONE) 20 MG tablet   Oral   Take 1 tablet (20 mg total) by mouth daily.   4 tablet   0     Allergies Review of patient's allergies indicates no known allergies.  History reviewed. No pertinent family history.  Social History Social History  Substance Use Topics  . Smoking status: Current Every Day Smoker  . Smokeless tobacco: Never Used  . Alcohol Use: Yes    Review of Systems  Constitutional: No  fever/chills. Eyes: No visual changes. ENT: Positive for dental pain. No sore throat. Cardiovascular: Denies chest pain. Respiratory: Denies shortness of breath. Gastrointestinal: No abdominal pain.  No nausea, no vomiting.  No diarrhea.  No constipation. Genitourinary: Negative for dysuria. Musculoskeletal: Negative for back pain. Skin: Negative for rash. Neurological: Negative for headaches, focal weakness or numbness.  10-point ROS otherwise negative.  ____________________________________________   PHYSICAL EXAM:  VITAL SIGNS: ED Triage Vitals  Enc Vitals Group     BP 11/17/15 0441 117/77 mmHg     Pulse Rate 11/17/15 0441 132     Resp 11/17/15 0441 20     Temp 11/17/15 0441 97.8 F (36.6 C)     Temp Source 11/17/15 0441 Oral     SpO2 11/17/15 0441 96 %     Weight 11/17/15 0441 170 lb (77.111 kg)     Height 11/17/15 0441 5\' 2"  (1.575 m)     Head Cir --      Peak Flow --      Pain Score 11/17/15 0443 9     Pain Loc --      Pain Edu? --      Excl. in GC? --     Constitutional: Alert and oriented. Well appearing and in mild acute distress. Eyes: Conjunctivae are normal. PERRL. EOMI. Head: Atraumatic. Nose: No congestion/rhinnorhea. Mouth/Throat: Mucous membranes are moist.  Oropharynx non-erythematous.  Left lower molar  with missing filling. Adjacent gum line swollen consistent with dental abscess. Neck: No stridor.   Cardiovascular: Normal rate, regular rhythm. Grossly normal heart sounds.  Good peripheral circulation. Respiratory: Normal respiratory effort.  No retractions. Lungs CTAB. Gastrointestinal: Soft and nontender. No distention. No abdominal bruits. No CVA tenderness. Musculoskeletal: No lower extremity tenderness nor edema.  No joint effusions. Neurologic:  Normal speech and language. No gross focal neurologic deficits are appreciated. No gait instability. Skin:  Skin is warm, dry and intact. No rash noted. Psychiatric: Mood and affect are normal. Speech  and behavior are normal.  ____________________________________________   LABS (all labs ordered are listed, but only abnormal results are displayed)  Labs Reviewed - No data to display ____________________________________________  EKG  None ____________________________________________  RADIOLOGY  None ____________________________________________   PROCEDURES  Procedure(s) performed: None  Critical Care performed: No  ____________________________________________   INITIAL IMPRESSION / ASSESSMENT AND PLAN / ED COURSE  Pertinent labs & imaging results that were available during my care of the patient were reviewed by me and considered in my medical decision making (see chart for details).  30 year old female who presents with dentalgia secondary to dental abscess. Already taking amoxicillin for URI. Will add analgesia and dental referral sheet for follow-up. Strict return precautions given. Patient verbalizes understanding and agrees with plan of care. ____________________________________________   FINAL CLINICAL IMPRESSION(S) / ED DIAGNOSES  Final diagnoses:  Dentalgia  Dental abscess      NEW MEDICATIONS STARTED DURING THIS VISIT:  New Prescriptions   No medications on file     Note:  This document was prepared using Dragon voice recognition software and may include unintentional dictation errors.    Irean Hong, MD 11/17/15 [phone]

## 2015-11-20 ENCOUNTER — Emergency Department
Admission: EM | Admit: 2015-11-20 | Discharge: 2015-11-20 | Disposition: A | Discharge status: Home / Self Care | Payer: Self-pay | Attending: Emergency Medicine | Admitting: Emergency Medicine

## 2015-11-20 DIAGNOSIS — K047 Periapical abscess without sinus: Secondary | ICD-10-CM | POA: Insufficient documentation

## 2015-11-20 DIAGNOSIS — F1290 Cannabis use, unspecified, uncomplicated: Secondary | ICD-10-CM | POA: Insufficient documentation

## 2015-11-20 DIAGNOSIS — F17200 Nicotine dependence, unspecified, uncomplicated: Secondary | ICD-10-CM | POA: Insufficient documentation

## 2015-11-20 DIAGNOSIS — F319 Bipolar disorder, unspecified: Secondary | ICD-10-CM | POA: Insufficient documentation

## 2015-11-20 DIAGNOSIS — K0889 Other specified disorders of teeth and supporting structures: Secondary | ICD-10-CM

## 2015-11-20 MED ORDER — OXYCODONE-ACETAMINOPHEN 5-325 MG PO TABS
2.0000 | ORAL_TABLET | Freq: Once | ORAL | Status: AC
  Administered 2015-11-20: 2 via ORAL
  Filled 2015-11-20: qty 2

## 2015-11-20 NOTE — ED Notes (Signed)
PT in with co toothache was seen here recently for the same given antibiotics and percocet.  Saw dentist today she also ran out of percocet today and was given vicodin for pain by dentist because ED only gave her 12 tabs of the percocet.  She states she is allergic to it and needs percocet prescription.

## 2015-11-20 NOTE — ED Provider Notes (Signed)
[facility] Emergency Department Provider Note  ____________________________________________  Time seen: 2:05 AM  I have reviewed the triage vital signs and the nursing notes.   HISTORY  Chief Complaint Dental Pain      HPI Brandi Fleming is a 30 y.o. female with recent visits to emergency department on 11/17/2015 for dental pain at which time she was prescribed Percocet antibiotics patient states that she saw the dentist today and was prescribed Vicodin however she is allergic to this medication. Patient states that she ran out of her pain medication today    Past Medical History  Diagnosis Date  . Bipolar 1 disorder [facility])     Patient Active Problem List   Diagnosis Date Noted  . Bipolar 1 disorder (HCC)     No past surgical history on file.  Current Outpatient Rx  Name  Route  Sig  Dispense  Refill  . amoxicillin (AMOXIL) 875 MG tablet   Oral   Take 1 tablet (875 mg total) by mouth 2 (two) times daily.   14 tablet   0   . ibuprofen (ADVIL,MOTRIN) 800 MG tablet   Oral   Take 1 tablet (800 mg total) by mouth every 8 (eight) hours as needed for moderate pain.   15 tablet   0   . oxyCODONE-acetaminophen (ROXICET) 5-325 MG tablet   Oral   Take 1 tablet by mouth every 4 (four) hours as needed for severe pain.   15 tablet   0   . predniSONE (DELTASONE) 20 MG tablet   Oral   Take 1 tablet (20 mg total) by mouth daily.   4 tablet   0     Allergies Vicodin  No family history on file.  Social History Social History  Substance Use Topics  . Smoking status: Current Every Day Smoker  . Smokeless tobacco: Never Used  . Alcohol Use: Yes    Review of Systems  Constitutional: Negative for fever. Eyes: Negative for visual changes. ENT: Negative for sore throat.Positive for dental pain Cardiovascular: Negative for chest pain. Respiratory: Negative for shortness of breath. Gastrointestinal: Negative for abdominal pain, vomiting  and diarrhea. Genitourinary: Negative for dysuria. Musculoskeletal: Negative for back pain. Skin: Negative for rash. Neurological: Negative for headaches, focal weakness or numbness.   10-point ROS otherwise negative.  ____________________________________________   PHYSICAL EXAM:  VITAL SIGNS: ED Triage Vitals  Enc Vitals Group     BP 11/20/15 0059 92/47 mmHg     Pulse Rate 11/20/15 0059 84     Resp 11/20/15 0059 20     Temp 11/20/15 0059 97.8 F (36.6 C)     Temp Source 11/20/15 0059 Oral     SpO2 11/20/15 0059 97 %     Weight 11/20/15 0059 170 lb (77.111 kg)     Height 11/20/15 0059  (1.575 m)     Head Cir --      Peak Flow --      Pain Score 11/20/15 0104 10     Pain Loc --      Pain Edu? --      Excl. in GC? --      Constitutional: Alert and oriented. Well appearing and in no distress. Eyes: Conjunctivae are normal. PERRL. Normal extraocular movements. ENT   Head: Normocephalic and atraumatic.   Nose: No congestion/rhinnorhea.   Mouth/Throat: Mucous membranes are moist.Left mandible molar dental caries with swelling the adjacent gum line.   Neck: No stridor. Hematological/Lymphatic/Immunilogical: No cervical  lymphadenopathy. Cardiovascular: Normal rate, regular rhythm. Normal and symmetric distal pulses are present in all extremities. No murmurs, rubs, or gallops. Respiratory: Normal respiratory effort without tachypnea nor retractions. Breath sounds are clear and equal bilaterally. No wheezes/rales/rhonchi. Gastrointestinal: Soft and nontender. No distention. There is no CVA tenderness. Genitourinary: deferred Musculoskeletal: Nontender with normal range of motion in all extremities. No joint effusions.  No lower extremity tenderness nor edema. Neurologic:  Normal speech and language. No gross focal neurologic deficits are appreciated. Speech is normal.  Skin:  Skin is warm, dry and intact. No rash noted. Psychiatric: Mood and affect are normal.  Speech and behavior are normal. Patient exhibits appropriate insight and judgment.    INITIAL IMPRESSION / ASSESSMENT AND PLAN / ED COURSE  Pertinent labs & imaging results that were available during my care of the patient were reviewed by me and considered in my medical decision making (see chart for details).    ____________________________________________   FINAL CLINICAL IMPRESSION(S) / ED DIAGNOSES  Final diagnoses:  Dentalgia  Dental abscess      Darci Currentandolph N Elmire Amrein, MD 11/20/15 0225

## 2015-11-20 NOTE — Discharge Instructions (Signed)
Dental Abscess °A dental abscess is a collection of pus in or around a tooth. °CAUSES °This condition is caused by a bacterial infection around the root of the tooth that involves the inner part of the tooth (pulp). It may result from: °· Severe tooth decay. °· Trauma to the tooth that allows bacteria to enter into the pulp, such as a broken or chipped tooth. °· Severe gum disease around a tooth. °SYMPTOMS °Symptoms of this condition include: °· Severe pain in and around the infected tooth. °· Swelling and redness around the infected tooth, in the mouth, or in the face. °· Tenderness. °· Pus drainage. °· Bad breath. °· Bitter taste in the mouth. °· Difficulty swallowing. °· Difficulty opening the mouth. °· Nausea. °· Vomiting. °· Chills. °· Swollen neck glands. °· Fever. °DIAGNOSIS °This condition is diagnosed with examination of the infected tooth. During the exam, your dentist may tap on the infected tooth. Your dentist will also ask about your medical and dental history and may order X-rays. °TREATMENT °This condition is treated by eliminating the infection. This may be done with: °· Antibiotic medicine. °· A root canal. This may be performed to save the tooth. °· Pulling (extracting) the tooth. This may also involve draining the abscess. This is done if the tooth cannot be saved. °HOME CARE INSTRUCTIONS °· Take medicines only as directed by your dentist. °· If you were prescribed antibiotic medicine, finish all of it even if you start to feel better. °· Rinse your mouth (gargle) often with salt water to relieve pain or swelling. °· Do not drive or operate heavy machinery while taking pain medicine. °· Do not apply heat to the outside of your mouth. °· Keep all follow-up visits as directed by your dentist. This is important. °SEEK MEDICAL CARE IF: °· Your pain is worse and is not helped by medicine. °SEEK IMMEDIATE MEDICAL CARE IF: °· You have a fever or chills. °· Your symptoms suddenly get worse. °· You have a  very bad headache. °· You have problems breathing or swallowing. °· You have trouble opening your mouth. °· You have swelling in your neck or around your eye. °  °This information is not intended to replace advice given to you by your health care provider. Make sure you discuss any questions you have with your health care provider. °  °Document Released: 06/28/2005 Document Revised: 11/12/2014 Document Reviewed: 06/25/2014 °Elsevier Interactive Patient Education ©2016 [employer] ° °Dental Pain °Dental pain may be caused by many things, including: °· Tooth decay (cavities or caries). Cavities expose the nerve of your tooth to air and hot or cold temperatures. This can cause pain or discomfort. °· Abscess or infection. A dental abscess is a collection of infected pus from a bacterial infection in the inner part of the tooth (pulp). It usually occurs at the end of the tooth's root. °· Injury. °· An unknown reason (idiopathic). °Your pain may be mild or severe. It may only occur when: °· You are chewing. °· You are exposed to hot or cold temperature. °· You are eating or drinking sugary foods or beverages, such as soda or candy. °Your pain may also be constant. °HOME CARE INSTRUCTIONS °Watch your dental pain for any changes. The following actions may help to lessen any discomfort that you are feeling: °· Take medicines only as directed by your dentist. °· If you were prescribed an antibiotic medicine, finish all of it even if you start to feel better. °· Keep all   follow-up visits as directed by your dentist. This is important. °· Do not apply heat to the outside of your face. °· Rinse your mouth or gargle with salt water if directed by your dentist. This helps with pain and swelling. °¨ You can make salt water by adding ¼ tsp of salt to 1 cup of warm water. °· Apply ice to the painful area of your face: °¨ Put ice in a plastic bag. °¨ Place a towel between your skin and the bag. °¨ Leave the ice on for 20 minutes,  2-3 times per day. °· Avoid foods or drinks that cause you pain, such as: °¨ Very hot or very cold foods or drinks. °¨ Sweet or sugary foods or drinks. °SEEK MEDICAL CARE IF: °· Your pain is not controlled with medicines. °· Your symptoms are worse. °· You have new symptoms. °SEEK IMMEDIATE MEDICAL CARE IF: °· You are unable to open your mouth. °· You are having trouble breathing or swallowing. °· You have a fever. °· Your face, neck, or jaw is swollen. °  °This information is not intended to replace advice given to you by your health care provider. Make sure you discuss any questions you have with your health care provider. °  °Document Released: 06/28/2005 Document Revised: 11/12/2014 Document Reviewed: 06/24/2014 °Elsevier Interactive Patient Education ©2016 [employer] ° °

## 2015-11-20 NOTE — ED Notes (Addendum)
Pt c/o dental pain to left jaw pain. Pt has some swelling to left jaw noted. Pt states she was seen here xfew days ago , pt was given antibiotics and Vicodin and can't get pain relief , pt states she is allergic to Vicodin. Pt A&O

## 2015-11-28 ENCOUNTER — Encounter: Payer: Self-pay | Admitting: Emergency Medicine

## 2015-11-28 ENCOUNTER — Emergency Department: Payer: Self-pay

## 2015-11-28 ENCOUNTER — Emergency Department
Admission: EM | Admit: 2015-11-28 | Discharge: 2015-11-28 | Disposition: A | Discharge status: Home / Self Care | Payer: Self-pay | Attending: Emergency Medicine | Admitting: Emergency Medicine

## 2015-11-28 DIAGNOSIS — F319 Bipolar disorder, unspecified: Secondary | ICD-10-CM | POA: Insufficient documentation

## 2015-11-28 DIAGNOSIS — F17210 Nicotine dependence, cigarettes, uncomplicated: Secondary | ICD-10-CM | POA: Insufficient documentation

## 2015-11-28 DIAGNOSIS — J45909 Unspecified asthma, uncomplicated: Secondary | ICD-10-CM | POA: Insufficient documentation

## 2015-11-28 LAB — POCT PREGNANCY, URINE: Preg Test, Ur: NEGATIVE

## 2015-11-28 MED ORDER — BENZONATATE 100 MG PO CAPS: 200.0000 mg | ORAL_CAPSULE | Freq: Three times a day (TID) | ORAL | Status: DC | PRN

## 2015-11-28 MED ORDER — IPRATROPIUM-ALBUTEROL 0.5-2.5 (3) MG/3ML IN SOLN
3.0000 mL | Freq: Once | RESPIRATORY_TRACT | Status: AC
  Administered 2015-11-28: 3 mL via RESPIRATORY_TRACT
  Filled 2015-11-28: qty 3

## 2015-11-28 MED ORDER — ALBUTEROL SULFATE HFA 108 (90 BASE) MCG/ACT IN AERS: 2.0000 | INHALATION_SPRAY | Freq: Four times a day (QID) | RESPIRATORY_TRACT | Status: DC | PRN

## 2015-11-28 MED ORDER — PREDNISONE 10 MG PO TABS: ORAL_TABLET | ORAL | Status: DC

## 2015-11-28 NOTE — ED Notes (Signed)
Pt presents with cough x 2 weeks that is worsening. States she has hours of non-stop coughing. She states that she can't sleep because of the cough. Pt is coughing up green and yellow phlegm. States she has not taken her temp but awakens sweaty in the mornings. NAD noted.

## 2015-11-28 NOTE — ED Notes (Signed)
Reports treated for URI 2 wks ago.  States coughing is worse.  No resp distress.

## 2015-11-28 NOTE — ED Provider Notes (Signed)
[facility] Emergency Department Provider Note   ____________________________________________  Time seen: Approximately 7:41 AM  I have reviewed the triage vital signs and the nursing notes.   HISTORY  Chief Complaint Cough   HPI Brandi Fleming is a 30 y.o. female is here with complaint of thumb productive cough for 2 weeks which has worsened and now she is unable sleep because of coughing. She states that occasionally she does bring up yellow/green phlegm.  She is unaware of any fever or chills. She denies any nausea, vomiting or diarrhea. She denies any history of bronchitis, asthma or pneumonia. She states the coughing has gotten worse in the last couple of days and she is coughing "nonstop". Patient is a smoker for the last 5 years approximately one fourth pack of cigarettes per day. Currently she rates her pain as a 1/10.   Past Medical History  Diagnosis Date  . Bipolar 1 disorder [facility])     Patient Active Problem List   Diagnosis Date Noted  . Bipolar 1 disorder (HCC)     No past surgical history on file.  Current Outpatient Rx  Name  Route  Sig  Dispense  Refill  . albuterol (PROVENTIL HFA;VENTOLIN HFA) 108 (90 Base) MCG/ACT inhaler   Inhalation   Inhale 2 puffs into the lungs every 6 (six) hours as needed for wheezing or shortness of breath.   1 Inhaler   2   . benzonatate (TESSALON PERLES) 100 MG capsule   Oral   Take 2 capsules (200 mg total) by mouth 3 (three) times daily as needed for cough.   40 capsule   0   . predniSONE (DELTASONE) 10 MG tablet      Take 6 tablets  today, on day 2 take 5 tablets, day 3 take 4 tablets, day 4 take 3 tablets, day 5 take  2 tablets and 1 tablet the last day   21 tablet   0     Allergies Vicodin  No family history on file.  Social History Social History  Substance Use Topics  . Smoking status: Current Every Day Smoker  . Smokeless tobacco: Never Used  . Alcohol Use: Yes    Review  of Systems Constitutional: No fever/chills Eyes: No visual changes. ENT: No sore throat. Cardiovascular: Denies chest pain. Respiratory: Denies shortness of breath. Persistent cough positive Gastrointestinal: No abdominal pain.  No nausea, no vomiting.  No diarrhea.   Musculoskeletal: Negative for back pain. Skin: Negative for rash. Neurological: Negative for headaches, focal weakness or numbness.  10-point ROS otherwise negative.  ____________________________________________   PHYSICAL EXAM:  VITAL SIGNS: ED Triage Vitals  Enc Vitals Group     BP 11/28/15 0735 150/106 mmHg     Pulse Rate 11/28/15 0735 111     Resp 11/28/15 0735 18     Temp 11/28/15 0735 97.8 F (36.6 C)     Temp Source 11/28/15 0735 Oral     SpO2 11/28/15 0735 97 %     Weight 11/28/15 0735 162 lb (73.483 kg)     Height 11/28/15 0735  (1.575 m)     Head Cir --      Peak Flow --      Pain Score 11/28/15 0730 1     Pain Loc --      Pain Edu? --      Excl. in GC? --     Constitutional: Alert and oriented. Well appearing and in no acute distress.  Eyes: Conjunctivae are normal. PERRL. EOMI. Head: Atraumatic. Nose: Mild congestion/no rhinnorhea.  EACs are clear bilaterally. TMs are dull without erythema or injection. Mouth/Throat: Mucous membranes are moist.  Oropharynx non-erythematous. Neck: No stridor.   Hematological/Lymphatic/Immunilogical: No cervical lymphadenopathy. Cardiovascular: Normal rate, regular rhythm. Grossly normal heart sounds.  Good peripheral circulation. Respiratory: Normal respiratory effort.  No retractions. Lungs expiratory wheezing heard bilaterally with a spasmodic congested cough. Gastrointestinal: Soft and nontender. No distention.  Musculoskeletal: Moves upper and lower extremities without difficulty. Normal gait was noted. Neurologic:  Normal speech and language. No gross focal neurologic deficits are appreciated. No gait instability. Skin:  Skin is warm, dry and intact.  No rash noted. Psychiatric: Mood and affect are normal. Speech and behavior are normal.  ____________________________________________   LABS (all labs ordered are listed, but only abnormal results are displayed)  Labs Reviewed  POC URINE PREG, ED  POCT PREGNANCY, URINE   ____________________________________________  EKG  Deferred ____________________________________________  RADIOLOGY  Chest x-ray per radiologist is normal. I, Tommi Rumpshonda L Canuto Kingston, personally viewed and evaluated these images (plain radiographs) as part of my medical decision making, as well as reviewing the written report by the radiologist. ____________________________________________   PROCEDURES  Procedure(s) performed: None  Critical Care performed: No  ____________________________________________   INITIAL IMPRESSION / ASSESSMENT AND PLAN / ED COURSE  Pertinent labs & imaging results that were available during my care of the patient were reviewed by me and considered in my medical decision making (see chart for details).  Patient was given a nebulizer treatment in the emergency room And improved with coughing. Patient was given a prescription for Tessalon Perles 3 times a day for cough, albuterol inhaler, and prednisone 60 mg 6 day taper. Patient [facility] if any continued problems. ____________________________________________   FINAL CLINICAL IMPRESSION(S) / ED DIAGNOSES  Final diagnoses:  Asthmatic bronchitis without complication  Cigarette smoker one half pack a day or less      NEW MEDICATIONS STARTED DURING THIS VISIT:  Discharge Medication List as of 11/28/2015  9:29 AM    START taking these medications   Details  albuterol (PROVENTIL HFA;VENTOLIN HFA) 108 (90 Base) MCG/ACT inhaler Inhale 2 puffs into the lungs every 6 (six) hours as needed for wheezing or shortness of breath., Starting 11/28/2015, Until Discontinued, Print    benzonatate (TESSALON PERLES)  100 MG capsule Take 2 capsules (200 mg total) by mouth 3 (three) times daily as needed for cough., Starting 11/28/2015, Until Sat 11/27/16, Print         Note:  This document was prepared using Dragon voice recognition software and may include unintentional dictation errors.    Tommi RumpsRhonda L Eloyse Causey, PA-C 11/28/15 1132  Arnaldo NatalPaul F Malinda, MD 11/28/15 [phone]

## 2015-11-28 NOTE — Discharge Instructions (Signed)
Begin taking prednisone today and taper down for the next 6 days. Albuterol inhaler as needed for coughing. Tessalon Perles 1 or 2 every 8 hours for coughing. Do not smoke which will cause the coughing to become worse. [facility] if any continued problems.

## 2015-11-28 NOTE — ED Notes (Signed)
Pt discharged home after verbalizing understanding of discharge instructions; nad noted. 

## 2016-02-05 ENCOUNTER — Encounter: Payer: Self-pay | Admitting: Emergency Medicine

## 2016-02-05 ENCOUNTER — Emergency Department: Payer: Self-pay

## 2016-02-05 ENCOUNTER — Emergency Department
Admission: EM | Admit: 2016-02-05 | Discharge: 2016-02-05 | Disposition: A | Discharge status: Home / Self Care | Payer: Self-pay | Attending: Emergency Medicine | Admitting: Emergency Medicine

## 2016-02-05 DIAGNOSIS — R0789 Other chest pain: Secondary | ICD-10-CM | POA: Insufficient documentation

## 2016-02-05 DIAGNOSIS — Z7951 Long term (current) use of inhaled steroids: Secondary | ICD-10-CM | POA: Insufficient documentation

## 2016-02-05 DIAGNOSIS — F319 Bipolar disorder, unspecified: Secondary | ICD-10-CM | POA: Insufficient documentation

## 2016-02-05 DIAGNOSIS — F17200 Nicotine dependence, unspecified, uncomplicated: Secondary | ICD-10-CM | POA: Insufficient documentation

## 2016-02-05 DIAGNOSIS — F1210 Cannabis abuse, uncomplicated: Secondary | ICD-10-CM | POA: Insufficient documentation

## 2016-02-05 LAB — CBC
HCT: 43 % (ref 35.0–47.0)
HEMOGLOBIN: 14.4 g/dL (ref 12.0–16.0)
MCH: 29.1 pg (ref 26.0–34.0)
MCHC: 33.5 g/dL (ref 32.0–36.0)
MCV: 86.8 fL (ref 80.0–100.0)
Platelets: 190 10*3/uL (ref 150–440)
RBC: 4.96 MIL/uL (ref 3.80–5.20)
RDW: 14.8 % — ABNORMAL HIGH (ref 11.5–14.5)
WBC: 6.1 10*3/uL (ref 3.6–11.0)

## 2016-02-05 LAB — BASIC METABOLIC PANEL
ANION GAP: 9 (ref 5–15)
BUN: 8 mg/dL (ref 6–20)
CO2: 26 mmol/L (ref 22–32)
CREATININE: 0.62 mg/dL (ref 0.44–1.00)
Calcium: 9.1 mg/dL (ref 8.9–10.3)
Chloride: 104 mmol/L (ref 101–111)
GFR calc non Af Amer: >60 mL/min (ref >60)
Glucose, Bld: 136 mg/dL — ABNORMAL HIGH (ref 65–99)
POTASSIUM: 3.9 mmol/L (ref 3.5–5.1)
Sodium: 139 mmol/L (ref 135–145)

## 2016-02-05 LAB — FIBRIN DERIVATIVES D-DIMER (ARMC ONLY): Fibrin derivatives D-dimer (ARMC): 280 (ref 0–499)

## 2016-02-05 LAB — TROPONIN I: Troponin I: <0.03 ng/mL (ref <0.03)

## 2016-02-05 MED ORDER — KETOROLAC TROMETHAMINE 30 MG/ML IJ SOLN
30.0000 mg | Freq: Once | INTRAMUSCULAR | Status: AC
  Administered 2016-02-05: 30 mg via INTRAMUSCULAR
  Filled 2016-02-05: qty 1

## 2016-02-05 MED ORDER — OXYCODONE-ACETAMINOPHEN 5-325 MG PO TABS
1.0000 | ORAL_TABLET | Freq: Four times a day (QID) | ORAL | 0 refills | Status: DC | PRN
Start: 2016-02-05 — End: 2016-09-28

## 2016-02-05 NOTE — ED Provider Notes (Addendum)
[facility] Emergency Department Provider Note  ____________________________________________   I have reviewed the triage vital signs and the nursing notes.   HISTORY  Chief Complaint Chest Pain    HPI Brandi Fleming is a 30 y.o. female who has no personal or family history of PE or DVT, denies any recent travel or leg swelling, denies any recent surgery, denies any cancer, presents a with right-sided pain. She states she slept on her chest wall funny and she may have lifted something heavy up a week ago and had pain in her ribs since that time and then today she coughed once fully "pop" and the pain became much worse. She denies any shortness of breath nausea or vomiting. She denies any radiation of the pain. It is sharp, on the right side is worse when she touches it is better when she sits still.  Past Medical History:  Diagnosis Date  . Bipolar 1 disorder [facility])     Patient Active Problem List   Diagnosis Date Noted  . Bipolar 1 disorder (HCC)     History reviewed. No pertinent surgical history.  Current Outpatient Rx  . Order #: [id number] Class: Print  . Order #: [id number] Class: Print  . Order #: [id number] Class: Print    Allergies Vicodin [hydrocodone-acetaminophen]  No family history on file.  Social History Social History  Substance Use Topics  . Smoking status: Current Every Day Smoker  . Smokeless tobacco: Never Used  . Alcohol use Yes    Review of Systems Constitutional: No fever/chills Eyes: No visual changes. ENT: No sore throat. No stiff neck no neck pain Cardiovascular: See history of present illness regarding chest pain. Respiratory: Denies shortness of breath. Gastrointestinal:   no vomiting.  No diarrhea.  No constipation. Genitourinary: Negative for dysuria. Musculoskeletal: Negative lower extremity swelling Skin: Negative for rash. Neurological: Negative for severe headaches, focal weakness or numbness. 10-point ROS  otherwise negative.  ____________________________________________   PHYSICAL EXAM:  VITAL SIGNS: ED Triage Vitals  Enc Vitals Group     BP 02/05/16 1630 127/86     Pulse Rate 02/05/16 1630 (!) 107     Resp 02/05/16 1630 20     Temp 02/05/16 1630 98.1 F (36.7 C)     Temp Source 02/05/16 1630 Oral     SpO2 02/05/16 1630 94 %     Weight 02/05/16 1630 160 lb (72.6 kg)     Height 02/05/16 1630  (1.575 m)     Head Circumference --      Peak Flow --      Pain Score 02/05/16 1631 8     Pain Loc --      Pain Edu? --      Excl. in GC? --     Constitutional: Alert and oriented. Well appearing and in no acute distress. Eyes: Conjunctivae are normal. PERRL. EOMI. Head: Atraumatic. Nose: No congestion/rhinnorhea. Mouth/Throat: Mucous membranes are moist.  Oropharynx non-erythematous. Neck: No stridor.   Nontender with no meningismus Cardiovascular: Normal rate, regular rhythm. Grossly normal heart sounds.  Good peripheral circulation. Respiratory: Normal respiratory effort.  No retractions. Lungs CTAB. Chest: Female nurse chaperone present. Patient with tenderness with patient the right chest wall. No evidence of breast lesion or mass. The patient of this area results in the patient jumping back and saying "ouch that's the pain right there". There is no shingles, there is no crepitus there is no flail chest. I couldn't specify the exact point where it is discomforting  in her ribs. Abdominal: Soft and nontender. No distention. No guarding no rebound Back:  There is no focal tenderness or step off.  there is no midline tenderness there are no lesions noted. there is no CVA tenderness Musculoskeletal: No lower extremity tenderness, no upper extremity tenderness. No joint effusions, no DVT signs strong distal pulses no edema Neurologic:  Normal speech and language. No gross focal neurologic deficits are appreciated.  Skin:  Skin is warm, dry and intact. No rash noted. Psychiatric: Mood  and affect are normal. Speech and behavior are normal.  ____________________________________________   LABS (all labs ordered are listed, but only abnormal results are displayed)  Labs Reviewed  BASIC METABOLIC PANEL - Abnormal; Notable for the following:       Result Value   Glucose, Bld 136 (*)    All other components within normal limits  CBC - Abnormal; Notable for the following:    RDW 14.8 (*)    All other components within normal limits  TROPONIN I  FIBRIN DERIVATIVES D-DIMER ([facility] ONLY)   ____________________________________________  EKG  I personally interpreted any EKGs ordered by me or triage Sinus tach rate 112 acute ST elevation or depression no acute ischemic changes normal axis, unremarkable EKG ____________________________________________  RADIOLOGY  I reviewed any imaging ordered by me or triage that were performed during my shift and, if possible, patient and/or family made aware of any abnormal findings. ____________________________________________   PROCEDURES  Procedure(s) performed: None  Procedures  Critical Care performed: None  ____________________________________________   INITIAL IMPRESSION / ASSESSMENT AND PLAN / ED COURSE  Pertinent labs & imaging results that were available during my care of the patient were reviewed by me and considered in my medical decision making (see chart for details).  Patient with very reproducible chest wall pain very low suspicion for PE however she is not perked negative. Given low suspicion and low pretest probability, we did do a d-dimer which is negative as expected. We have given her pain medication which has helped make her feel better. At this time, I do not think the patient requires emergent computed tomography of her chest with this attendant radiation burden. TAt this time, there does not appear to be clinical evidence to support the diagnosis of pulmonary embolus, dissection, myocarditis,  endocarditis, pericarditis, pericardial tamponade, acute coronary syndrome, pneumothorax, pneumonia, or any other acute intrathoracic pathology that will require admission or acute intervention. Nor is there evidence of any significant intra-abdominal pathology causing this discomfort. We will treat her pain, we will give her incentive spirometry, we have her follow closely with outpt clinic  Clinical Course   ----------------------------------------- 7:25 PM on 02/05/2016 -----------------------------------------  Patient feels somewhat better able to move better after Toradol. Initial heart rate was elevated but she states she is quite anxious and she was. She feels better now heart rate reflexes. Able to take deep breaths Quite well-appearing either to go home. Not allergic to Percocet has itching with hydrocodone but has never had a problem with Percocet. Given that she is having pain with motion I do not wish her to develop pneumonia we'll give her a few days worth of pain medication as she states that NSAIDs have not helped yet. ____________________________________________   FINAL CLINICAL IMPRESSION(S) / ED DIAGNOSES  Final diagnoses:  None      This chart was dictated using voice recognition software.  Despite best efforts to proofread,  errors can occur which can change meaning.      Rudy Jew  Alphonzo Lemmings, MD 02/05/16 1911    Jeanmarie Plant, MD 02/05/16 1926    Jeanmarie Plant, MD 02/05/16 [phone]

## 2016-02-05 NOTE — ED Notes (Signed)
Discharge instructions reviewed with patient. Questions fielded by this RN. Patient verbalizes understanding of instructions. Patient discharged home in stable condition per Mcshane MD . No acute distress noted at time of discharge.   

## 2016-02-05 NOTE — ED Notes (Signed)
Pt reports that she is having chest pain that started a week ago - she states she does not have insurance so she has been trying to deal with the pain - approx 2 hours ago she coughed and felt a "pop" where the chest pain is and now she is having pain with all movement - Pain is located under right breast and into right upper side - pt states that since the "pop" it hurts to breath and she is having to take shallow breaths to avoid the pain

## 2016-02-05 NOTE — ED Triage Notes (Addendum)
Pt presents to ED with reports of right side chest and rib pain that radiates to her right arm. Pt states today she coughed and heard a pop on her right side. Pt reports a little shortness of breath but denies any other symptoms. Pt speaking in complete sentences without difficulty. Pt in no apparent distress in triage.

## 2016-06-08 LAB — OB RESULTS CONSOLE HEPATITIS B SURFACE ANTIGEN: HEP B S AG: NEGATIVE

## 2016-06-08 LAB — OB RESULTS CONSOLE VARICELLA ZOSTER ANTIBODY, IGG: Varicella: IMMUNE

## 2016-06-08 LAB — OB RESULTS CONSOLE RUBELLA ANTIBODY, IGM: RUBELLA: IMMUNE

## 2016-07-12 NOTE — L&D Delivery Note (Signed)
VAGINAL DELIVERY NOTE:  Date of Delivery: 12/27/2016 Primary OB: [facility]  Gestational Age/EDD: 682w5d 01/12/2017, by Other Basis Antepartum complications: Asthma severe responsive to Pulmicort, Spriva, Steroids on occas, Brovana Attending Physician: Hal NeerJones, CNM Delivery Type: NSVD of viable female infant   Anesthesia: none Laceration: None  Episiotomy None Placenta: SDOP intact, Lengthy cord approx 45 cms in length Intrapartum complications: Variable decels  Estimated Blood Loss: 300 mls GBS: Neg  Procedure Details: NSVD of viable female infant in OA pos restituted to LOA with CAN x 1 reduced, Ant and post shoulder and body del at 2233 and to mom's abd with stunned response, Apgars 6/9, Taken to warmer after CCx2 and cut due to baby weakly crying initially. SDOP intact at 2238, Approx 45 cm cord in length. No needles and no sponges. VSS. Hemostasis achieved. Perineum intact. Skin to skin promoted. Bonding well with baby.   Baby: Liveborn: Female  Apgars 6/9, weight  #,  oz, baby named "Brielle" Plans to breastfeed. Baby not weighed due to skin to skin bonding for a long period

## 2016-07-29 ENCOUNTER — Encounter: Payer: Self-pay | Admitting: Emergency Medicine

## 2016-07-29 ENCOUNTER — Emergency Department
Admission: EM | Admit: 2016-07-29 | Discharge: 2016-07-29 | Disposition: A | Discharge status: Home / Self Care | Payer: Medicaid Other | Attending: Emergency Medicine | Admitting: Emergency Medicine

## 2016-07-29 DIAGNOSIS — O99332 Smoking (tobacco) complicating pregnancy, second trimester: Secondary | ICD-10-CM | POA: Diagnosis not present

## 2016-07-29 DIAGNOSIS — Z79899 Other long term (current) drug therapy: Secondary | ICD-10-CM | POA: Insufficient documentation

## 2016-07-29 DIAGNOSIS — Z3A16 16 weeks gestation of pregnancy: Secondary | ICD-10-CM | POA: Insufficient documentation

## 2016-07-29 DIAGNOSIS — K529 Noninfective gastroenteritis and colitis, unspecified: Secondary | ICD-10-CM | POA: Diagnosis not present

## 2016-07-29 DIAGNOSIS — F17200 Nicotine dependence, unspecified, uncomplicated: Secondary | ICD-10-CM | POA: Insufficient documentation

## 2016-07-29 DIAGNOSIS — O99612 Diseases of the digestive system complicating pregnancy, second trimester: Secondary | ICD-10-CM | POA: Diagnosis present

## 2016-07-29 LAB — CBC
HCT: 40 % (ref 35.0–47.0)
Hemoglobin: 13.8 g/dL (ref 12.0–16.0)
MCH: 30.9 pg (ref 26.0–34.0)
MCHC: 34.4 g/dL (ref 32.0–36.0)
MCV: 89.7 fL (ref 80.0–100.0)
PLATELETS: 199 10*3/uL (ref 150–440)
RBC: 4.46 MIL/uL (ref 3.80–5.20)
RDW: 13.9 % (ref 11.5–14.5)
WBC: 14.1 10*3/uL — ABNORMAL HIGH (ref 3.6–11.0)

## 2016-07-29 LAB — URINALYSIS, COMPLETE (UACMP) WITH MICROSCOPIC
Bilirubin Urine: NEGATIVE
GLUCOSE, UA: NEGATIVE mg/dL
Hgb urine dipstick: NEGATIVE
Ketones, ur: NEGATIVE mg/dL
Leukocytes, UA: NEGATIVE
Nitrite: NEGATIVE
PH: 5 (ref 5.0–8.0)
Protein, ur: NEGATIVE mg/dL
RBC / HPF: NONE SEEN RBC/hpf (ref 0–5)
SPECIFIC GRAVITY, URINE: 1.019 (ref 1.005–1.030)

## 2016-07-29 LAB — COMPREHENSIVE METABOLIC PANEL
ALK PHOS: 42 U/L (ref 38–126)
ALT: 21 U/L (ref 14–54)
AST: 22 U/L (ref 15–41)
Albumin: 3.4 g/dL — ABNORMAL LOW (ref 3.5–5.0)
Anion gap: 8 (ref 5–15)
BUN: 6 mg/dL (ref 6–20)
CALCIUM: 8.6 mg/dL — AB (ref 8.9–10.3)
CO2: 21 mmol/L — AB (ref 22–32)
Chloride: 105 mmol/L (ref 101–111)
Glucose, Bld: 102 mg/dL — ABNORMAL HIGH (ref 65–99)
Potassium: 3.4 mmol/L — ABNORMAL LOW (ref 3.5–5.1)
SODIUM: 134 mmol/L — AB (ref 135–145)
Total Bilirubin: 0.9 mg/dL (ref 0.3–1.2)
Total Protein: 6.5 g/dL (ref 6.5–8.1)

## 2016-07-29 LAB — LIPASE, BLOOD: Lipase: 15 U/L (ref 11–51)

## 2016-07-29 MED ORDER — ONDANSETRON HCL 4 MG/2ML IJ SOLN
4.0000 mg | Freq: Once | INTRAMUSCULAR | Status: AC
  Administered 2016-07-29: 4 mg via INTRAVENOUS

## 2016-07-29 MED ORDER — ONDANSETRON HCL 4 MG/2ML IJ SOLN
INTRAMUSCULAR | Status: AC
  Administered 2016-07-29: 4 mg via INTRAVENOUS
  Filled 2016-07-29: qty 2

## 2016-07-29 MED ORDER — SODIUM CHLORIDE 0.9 % IV SOLN
1000.0000 mL | Freq: Once | INTRAVENOUS | Status: AC
  Administered 2016-07-29: 1000 mL via INTRAVENOUS

## 2016-07-29 NOTE — ED Provider Notes (Addendum)
[facility] Emergency Department Provider Note   ____________________________________________    I have reviewed the triage vital signs and the nursing notes.   HISTORY  Chief Complaint Emesis     HPI MIATA CULBRETH is a 31 y.o. female who reports she is 16-1/[redacted] weeks pregnant and presents with complaints of nausea vomiting and diarrhea over the last 2 days. She is concerned that she is becoming dehydrated. She denies abdominal pain. No vaginal bleeding. She does report some body aches. No fevers reported. No sick contacts.    Past Medical History:  Diagnosis Date  . Bipolar 1 disorder [facility])     Patient Active Problem List   Diagnosis Date Noted  . Bipolar 1 disorder (HCC)     History reviewed. No pertinent surgical history.  Prior to Admission medications   Medication Sig Start Date End Date Taking? Authorizing Provider  albuterol (PROVENTIL HFA;VENTOLIN HFA) 108 (90 Base) MCG/ACT inhaler Inhale 2 puffs into the lungs every 6 (six) hours as needed for wheezing or shortness of breath. 11/28/15   Tommi Rumps, PA-C  benzonatate (TESSALON PERLES) 100 MG capsule Take 2 capsules (200 mg total) by mouth 3 (three) times daily as needed for cough. 11/28/15 11/27/16  Tommi Rumps, PA-C  oxyCODONE-acetaminophen (ROXICET) 5-325 MG tablet Take 1 tablet by mouth every 6 (six) hours as needed. 02/05/16   Jeanmarie Plant, MD  predniSONE (DELTASONE) 10 MG tablet Take 6 tablets  today, on day 2 take 5 tablets, day 3 take 4 tablets, day 4 take 3 tablets, day 5 take  2 tablets and 1 tablet the last day 11/28/15   Tommi Rumps, PA-C     Allergies Vicodin [hydrocodone-acetaminophen]  History reviewed. No pertinent family history.  Social History Social History  Substance Use Topics  . Smoking status: Current Every Day Smoker  . Smokeless tobacco: Never Used  . Alcohol use Yes    Review of Systems  Constitutional: No  fever/chills  Cardiovascular: Denies Palpitations Respiratory: DeniesCough Gastrointestinal: As above Genitourinary: Negative for dysuria. No vaginal bleeding Musculoskeletal: Negative for back pain. Positive myalgias Skin: Negative for rash. Neurological: Negative for headaches or weakness  10-point ROS otherwise negative.  ____________________________________________   PHYSICAL EXAM:  VITAL SIGNS: ED Triage Vitals  Enc Vitals Group     BP 07/29/16 1643 123/66     Pulse Rate 07/29/16 1643 (!) 101     Resp 07/29/16 1643 16     Temp 07/29/16 1643 98.3 F (36.8 C)     Temp Source 07/29/16 1643 Oral     SpO2 07/29/16 1643 97 %     Weight 07/29/16 1644 186 lb (84.4 kg)     Height 07/29/16 1644 5\' 2"  (1.575 m)     Head Circumference --      Peak Flow --      Pain Score 07/29/16 1645 6     Pain Loc --      Pain Edu? --      Excl. in GC? --     Constitutional: Alert and oriented. No acute distress. Pleasant and interactive E Mouth/Throat: Mucous membranes are moist.    Cardiovascular: Mild tachycardia, regular rhythm. Grossly normal heart sounds.  Good peripheral circulation. Respiratory: Normal respiratory effort.  No retractions. Lungs CTAB. Gastrointestinal: Soft and nontender. No distention.  No CVA tenderness. Genitourinary: deferred Musculoskeletal: No lower extremity tenderness nor edema.  Warm and well perfused Neurologic:  Normal speech and language. No gross  focal neurologic deficits are appreciated.  Skin:  Skin is warm, dry and intact. No rash noted. Psychiatric: Mood and affect are normal. Speech and behavior are normal.  ____________________________________________   LABS (all labs ordered are listed, but only abnormal results are displayed)  Labs Reviewed  COMPREHENSIVE METABOLIC PANEL - Abnormal; Notable for the following:       Result Value   Sodium 134 (*)    Potassium 3.4 (*)    CO2 21 (*)    Glucose, Bld 102 (*)    Creatinine, Ser <0.30 (*)     Calcium 8.6 (*)    Albumin 3.4 (*)    All other components within normal limits  CBC - Abnormal; Notable for the following:    WBC 14.1 (*)    All other components within normal limits  URINALYSIS, COMPLETE (UACMP) WITH MICROSCOPIC - Abnormal; Notable for the following:    Color, Urine YELLOW (*)    APPearance CLEAR (*)    Bacteria, UA RARE (*)    Squamous Epithelial / LPF 0-5 (*)    All other components within normal limits  LIPASE, BLOOD   ____________________________________________  EKG  None ____________________________________________  RADIOLOGY  None ____________________________________________   PROCEDURES  Procedure(s) performed: No    Critical Care performed: No ____________________________________________   INITIAL IMPRESSION / ASSESSMENT AND PLAN / ED COURSE  Pertinent labs & imaging results that were available during my care of the patient were reviewed by me and considered in my medical decision making (see chart for details).  Patient presents with nausea vomiting and diarrhea, labwork is overall reassuring. She does have a mild elevation in white blood cell count likely related to viral gastroenteritis which is prevalent in community at this time. We will treat with IV fluids and nausea medication and reevaluate. ----------------------------------------- 9:08 PM on 07/29/2016 -----------------------------------------  Patient reports feeling significantly better after treatment. She is tolerating by mouth's. Feel she is appropriate for discharge at this time with follow-up with her OB. She knows to return if any worsening of her condition. Patient agrees with discharge plan.   ____________________________________________   FINAL CLINICAL IMPRESSION(S) / ED DIAGNOSES  Final diagnoses:  Gastroenteritis      NEW MEDICATIONS STARTED DURING THIS VISIT:  New Prescriptions   No medications on file     Note:  This document was prepared  using Dragon voice recognition software and may include unintentional dictation errors.    Jene Everyobert Braxden Lovering, MD 07/29/16 2109    Jene Everyobert Tima Curet, MD 07/29/16 2109

## 2016-07-29 NOTE — ED Triage Notes (Signed)
Pt c/o NVD for 3 days. Has had watery diarrhea per pt. Generalized abdominal pain. Pt is 16-[redacted] weeks pregnant. G2P1. Denies vaginal bleeding. Last episode vomiting was about 1.5 hrs ago.

## 2016-07-29 NOTE — ED Notes (Signed)
Pt stating n/v/d for the last 3 days. Pt stating that she was concerned because she is 16.[redacted] weeks pregnant and hadn't felt the baby moving today. Pt is stating abdominal pain form vomiting. Pt stating last emesis was in the waiting area and last BM was PTA. Pt stating she has had fevers on and off.

## 2016-08-10 ENCOUNTER — Emergency Department: Payer: Medicaid Other

## 2016-08-10 ENCOUNTER — Emergency Department
Admission: EM | Admit: 2016-08-10 | Discharge: 2016-08-10 | Disposition: A | Discharge status: Home / Self Care | Payer: Medicaid Other | Attending: Emergency Medicine | Admitting: Emergency Medicine

## 2016-08-10 ENCOUNTER — Encounter: Payer: Self-pay | Admitting: Emergency Medicine

## 2016-08-10 DIAGNOSIS — Z3A18 18 weeks gestation of pregnancy: Secondary | ICD-10-CM | POA: Diagnosis not present

## 2016-08-10 DIAGNOSIS — O0281 Inappropriate change in quantitative human chorionic gonadotropin (hCG) in early pregnancy: Secondary | ICD-10-CM | POA: Diagnosis not present

## 2016-08-10 DIAGNOSIS — O99332 Smoking (tobacco) complicating pregnancy, second trimester: Secondary | ICD-10-CM | POA: Diagnosis not present

## 2016-08-10 DIAGNOSIS — Z79899 Other long term (current) drug therapy: Secondary | ICD-10-CM | POA: Insufficient documentation

## 2016-08-10 DIAGNOSIS — F17200 Nicotine dependence, unspecified, uncomplicated: Secondary | ICD-10-CM | POA: Insufficient documentation

## 2016-08-10 DIAGNOSIS — O26892 Other specified pregnancy related conditions, second trimester: Secondary | ICD-10-CM | POA: Insufficient documentation

## 2016-08-10 DIAGNOSIS — R109 Unspecified abdominal pain: Secondary | ICD-10-CM

## 2016-08-10 LAB — LIPASE, BLOOD: Lipase: 21 U/L (ref 11–51)

## 2016-08-10 LAB — COMPREHENSIVE METABOLIC PANEL
ALK PHOS: 40 U/L (ref 38–126)
ALT: 16 U/L (ref 14–54)
ANION GAP: 8 (ref 5–15)
AST: 21 U/L (ref 15–41)
Albumin: 2.9 g/dL — ABNORMAL LOW (ref 3.5–5.0)
BUN: 6 mg/dL (ref 6–20)
CALCIUM: 7.8 mg/dL — AB (ref 8.9–10.3)
CO2: 21 mmol/L — AB (ref 22–32)
Chloride: 106 mmol/L (ref 101–111)
Creatinine, Ser: 0.35 mg/dL — ABNORMAL LOW (ref 0.44–1.00)
GFR calc non Af Amer: >60 mL/min (ref >60)
Glucose, Bld: 101 mg/dL — ABNORMAL HIGH (ref 65–99)
POTASSIUM: 3 mmol/L — AB (ref 3.5–5.1)
SODIUM: 135 mmol/L (ref 135–145)
Total Bilirubin: 0.5 mg/dL (ref 0.3–1.2)
Total Protein: 5.8 g/dL — ABNORMAL LOW (ref 6.5–8.1)

## 2016-08-10 LAB — CBC
HCT: 35.6 % (ref 35.0–47.0)
Hemoglobin: 12.3 g/dL (ref 12.0–16.0)
MCH: 30.7 pg (ref 26.0–34.0)
MCHC: 34.4 g/dL (ref 32.0–36.0)
MCV: 89.4 fL (ref 80.0–100.0)
PLATELETS: 196 10*3/uL (ref 150–440)
RBC: 3.99 MIL/uL (ref 3.80–5.20)
RDW: 13.3 % (ref 11.5–14.5)
WBC: 9.1 10*3/uL (ref 3.6–11.0)

## 2016-08-10 LAB — HCG, QUANTITATIVE, PREGNANCY: HCG, BETA CHAIN, QUANT, S: 22396 m[IU]/mL — AB (ref <5)

## 2016-08-10 LAB — POCT PREGNANCY, URINE: PREG TEST UR: POSITIVE — AB

## 2016-08-10 NOTE — ED Notes (Signed)
Pt taken to US

## 2016-08-10 NOTE — ED Provider Notes (Signed)
[facility] Emergency Department Provider Note   ____________________________________________    I have reviewed the triage vital signs and the nursing notes.   HISTORY  Chief Complaint Abdominal Pain     HPI Brandi Fleming is a 31 y.o. female who is [redacted] weeks pregnant who presents with complaints of mild abdominal cramping. She has recently recovered from gastroenteritis and overall feels well. She denies nausea or vomiting. No vaginal bleeding. No vaginal discharge. No fevers or chills. No dysuria or frequency. She has not seen her gynecologist   Past Medical History:  Diagnosis Date  . Bipolar 1 disorder [facility](HCC)     Patient Active Problem List   Diagnosis Date Noted  . Bipolar 1 disorder (HCC)     History reviewed. No pertinent surgical history.  Prior to Admission medications   Medication Sig Start Date End Date Taking? Authorizing Provider  albuterol (PROVENTIL HFA;VENTOLIN HFA) 108 (90 Base) MCG/ACT inhaler Inhale 2 puffs into the lungs every 6 (six) hours as needed for wheezing or shortness of breath. 11/28/15   Tommi Rumpshonda L Summers, PA-C  benzonatate (TESSALON PERLES) 100 MG capsule Take 2 capsules (200 mg total) by mouth 3 (three) times daily as needed for cough. 11/28/15 11/27/16  Tommi Rumpshonda L Summers, PA-C  oxyCODONE-acetaminophen (ROXICET) 5-325 MG tablet Take 1 tablet by mouth every 6 (six) hours as needed. 02/05/16   Jeanmarie PlantJames A McShane, MD  predniSONE (DELTASONE) 10 MG tablet Take 6 tablets  today, on day 2 take 5 tablets, day 3 take 4 tablets, day 4 take 3 tablets, day 5 take  2 tablets and 1 tablet the last day 11/28/15   Tommi Rumpshonda L Summers, PA-C     Allergies Vicodin [hydrocodone-acetaminophen]  History reviewed. No pertinent family history.  Social History Social History  Substance Use Topics  . Smoking status: Current Some Day Smoker  . Smokeless tobacco: Never Used  . Alcohol use No    Review of Systems  Constitutional: No  fever/chills  Respiratory: Denies shortness of breath. Gastrointestinal: As above Genitourinary: Negative for dysuria. Musculoskeletal: Negative for back pain. Skin: Negative for rash. Neurological: Negative for headaches   10-point ROS otherwise negative.  ____________________________________________   PHYSICAL EXAM:  VITAL SIGNS: ED Triage Vitals  Enc Vitals Group     BP 08/10/16 0925 110/74     Pulse Rate 08/10/16 0925 91     Resp 08/10/16 0925 18     Temp 08/10/16 0925 98.7 F (37.1 C)     Temp Source 08/10/16 0925 Oral     SpO2 08/10/16 0925 99 %     Weight 08/10/16 0926 186 lb (84.4 kg)     Height 08/10/16 0926 5\' 2"  (1.575 m)     Head Circumference --      Peak Flow --      Pain Score 08/10/16 0940 5     Pain Loc --      Pain Edu? --      Excl. in GC? --     Constitutional: Alert and oriented. No acute distress. Pleasant and interactive Eyes: Conjunctivae are normal.   Nose: No congestion/rhinnorhea. Mouth/Throat: Mucous membranes are moist.    Cardiovascular: Normal rate, regular rhythm. Grossly normal heart sounds.  Good peripheral circulation. Respiratory: Normal respiratory effort.  No retractions. Lungs CTAB. Gastrointestinal: Soft and nontender. No distention.  No CVA tenderness. Genitourinary: deferred Musculoskeletal: No lower extremity tenderness nor edema.  Warm and well perfused Neurologic:  Normal speech and language. No  gross focal neurologic deficits are appreciated.  Skin:  Skin is warm, dry and intact. No rash noted. Psychiatric: Mood and affect are normal. Speech and behavior are normal.  ____________________________________________   LABS (all labs ordered are listed, but only abnormal results are displayed)  Labs Reviewed  COMPREHENSIVE METABOLIC PANEL - Abnormal; Notable for the following:       Result Value   Potassium 3.0 (*)    CO2 21 (*)    Glucose, Bld 101 (*)    Creatinine, Ser 0.35 (*)    Calcium 7.8 (*)    Total  Protein 5.8 (*)    Albumin 2.9 (*)    All other components within normal limits  HCG, QUANTITATIVE, PREGNANCY - Abnormal; Notable for the following:    hCG, Beta Chain, Quant, S 22,396 (*)    All other components within normal limits  POCT PREGNANCY, URINE - Abnormal; Notable for the following:    Preg Test, Ur POSITIVE (*)    All other components within normal limits  CBC  LIPASE, BLOOD   ____________________________________________  EKG  None ____________________________________________  RADIOLOGY  Ultrasound unremarkable ____________________________________________   PROCEDURES  Procedure(s) performed: No    Critical Care performed: No ____________________________________________   INITIAL IMPRESSION / ASSESSMENT AND PLAN / ED COURSE  Pertinent labs & imaging results that were available during my care of the patient were reviewed by me and considered in my medical decision making (see chart for details).  Patient presents with complaints of periumbilical abdominal cramping. Exam is quite reassuring. Lab works unremarkable. Ultrasound is normal. Patient is anxious to leave as she needs to make a meeting by 12:30. She reports she feels completely better. She will follow-up with her obstetrician   ____________________________________________   FINAL CLINICAL IMPRESSION(S) / ED DIAGNOSES  Final diagnoses:  Abdominal pain during pregnancy in second trimester      NEW MEDICATIONS STARTED DURING THIS VISIT:  New Prescriptions   No medications on file     Note:  This document was prepared using Dragon voice recognition software and may include unintentional dictation errors.    Jene Every, MD 08/10/16 1209

## 2016-08-10 NOTE — ED Notes (Signed)
While pt was waiting to be traiged, pt noted to be drinking and eating with no acute distress noted.

## 2016-08-10 NOTE — ED Triage Notes (Signed)
Pt to triage with c/o abd x 2 days. Pain 5/10 and is worse with movement. Pt had loose stool this am. Pt is [redacted] weeks pregnant. 2nd pregnancy. Carried first to term.

## 2016-09-26 ENCOUNTER — Emergency Department: Payer: Medicaid Other

## 2016-09-26 ENCOUNTER — Encounter: Payer: Self-pay | Admitting: Emergency Medicine

## 2016-09-26 ENCOUNTER — Inpatient Hospital Stay
Admission: EM | Admit: 2016-09-26 | Discharge: 2016-09-28 | DRG: 781 | Disposition: A | Discharge status: Home / Self Care | Payer: Medicaid Other | Attending: Obstetrics and Gynecology | Admitting: Obstetrics and Gynecology

## 2016-09-26 DIAGNOSIS — Z8249 Family history of ischemic heart disease and other diseases of the circulatory system: Secondary | ICD-10-CM | POA: Diagnosis not present

## 2016-09-26 DIAGNOSIS — O99512 Diseases of the respiratory system complicating pregnancy, second trimester: Principal | ICD-10-CM | POA: Diagnosis present

## 2016-09-26 DIAGNOSIS — Z87891 Personal history of nicotine dependence: Secondary | ICD-10-CM | POA: Diagnosis not present

## 2016-09-26 DIAGNOSIS — O99519 Diseases of the respiratory system complicating pregnancy, unspecified trimester: Secondary | ICD-10-CM

## 2016-09-26 DIAGNOSIS — F319 Bipolar disorder, unspecified: Secondary | ICD-10-CM | POA: Diagnosis present

## 2016-09-26 DIAGNOSIS — Z3A24 24 weeks gestation of pregnancy: Secondary | ICD-10-CM | POA: Diagnosis not present

## 2016-09-26 DIAGNOSIS — R0602 Shortness of breath: Secondary | ICD-10-CM | POA: Diagnosis present

## 2016-09-26 DIAGNOSIS — J45909 Unspecified asthma, uncomplicated: Secondary | ICD-10-CM | POA: Diagnosis present

## 2016-09-26 DIAGNOSIS — O99342 Other mental disorders complicating pregnancy, second trimester: Secondary | ICD-10-CM | POA: Diagnosis present

## 2016-09-26 DIAGNOSIS — R05 Cough: Secondary | ICD-10-CM

## 2016-09-26 DIAGNOSIS — R059 Cough, unspecified: Secondary | ICD-10-CM

## 2016-09-26 HISTORY — DX: Unspecified asthma, uncomplicated: J45.909

## 2016-09-26 LAB — BASIC METABOLIC PANEL
Anion gap: 7 (ref 5–15)
BUN: 6 mg/dL (ref 6–20)
CALCIUM: 8.3 mg/dL — AB (ref 8.9–10.3)
CO2: 25 mmol/L (ref 22–32)
CREATININE: 0.4 mg/dL — AB (ref 0.44–1.00)
Chloride: 105 mmol/L (ref 101–111)
GFR calc Af Amer: >60 mL/min (ref >60)
GLUCOSE: 107 mg/dL — AB (ref 65–99)
Potassium: 3.1 mmol/L — ABNORMAL LOW (ref 3.5–5.1)
SODIUM: 137 mmol/L (ref 135–145)

## 2016-09-26 LAB — CBC
HCT: 33.5 % — ABNORMAL LOW (ref 35.0–47.0)
HEMOGLOBIN: 11.9 g/dL — AB (ref 12.0–16.0)
MCH: 31.7 pg (ref 26.0–34.0)
MCHC: 35.6 g/dL (ref 32.0–36.0)
MCV: 89.1 fL (ref 80.0–100.0)
PLATELETS: 220 10*3/uL (ref 150–440)
RBC: 3.76 MIL/uL — ABNORMAL LOW (ref 3.80–5.20)
RDW: 12.8 % (ref 11.5–14.5)
WBC: 8.6 10*3/uL (ref 3.6–11.0)

## 2016-09-26 LAB — MAGNESIUM: MAGNESIUM: 1.7 mg/dL (ref 1.7–2.4)

## 2016-09-26 LAB — TROPONIN I

## 2016-09-26 MED ORDER — POTASSIUM CHLORIDE CRYS ER 20 MEQ PO TBCR
20.0000 meq | EXTENDED_RELEASE_TABLET | Freq: Two times a day (BID) | ORAL | Status: DC
  Administered 2016-09-26 (×2): 20 meq via ORAL
  Filled 2016-09-26 (×2): qty 1

## 2016-09-26 MED ORDER — ALBUTEROL SULFATE (2.5 MG/3ML) 0.083% IN NEBU
5.0000 mg | INHALATION_SOLUTION | Freq: Once | RESPIRATORY_TRACT | Status: AC
  Administered 2016-09-26: 5 mg via RESPIRATORY_TRACT
  Filled 2016-09-26: qty 6

## 2016-09-26 MED ORDER — AEROCHAMBER PLUS FLO-VU MEDIUM MISC
1.0000 | Freq: Once | Status: DC
  Filled 2016-09-26: qty 1

## 2016-09-26 MED ORDER — ALBUTEROL SULFATE HFA 108 (90 BASE) MCG/ACT IN AERS: 2.0000 | INHALATION_SPRAY | Freq: Four times a day (QID) | RESPIRATORY_TRACT | 2 refills | Status: DC | PRN

## 2016-09-26 MED ORDER — IPRATROPIUM-ALBUTEROL 0.5-2.5 (3) MG/3ML IN SOLN
3.0000 mL | Freq: Once | RESPIRATORY_TRACT | Status: AC
  Administered 2016-09-26: 3 mL via RESPIRATORY_TRACT
  Filled 2016-09-26: qty 3

## 2016-09-26 MED ORDER — AMOXICILLIN-POT CLAVULANATE 875-125 MG PO TABS
1.0000 | ORAL_TABLET | Freq: Two times a day (BID) | ORAL | Status: DC
  Administered 2016-09-26 – 2016-09-28 (×4): 1 via ORAL
  Filled 2016-09-26 (×6): qty 1

## 2016-09-26 MED ORDER — ALBUTEROL SULFATE (2.5 MG/3ML) 0.083% IN NEBU
5.0000 mg | INHALATION_SOLUTION | Freq: Once | RESPIRATORY_TRACT | Status: AC
Start: 2016-09-26 — End: 2016-09-26
  Administered 2016-09-26: 5 mg via RESPIRATORY_TRACT
  Filled 2016-09-26: qty 6

## 2016-09-26 MED ORDER — GUAIFENESIN 100 MG/5ML PO SOLN
15.0000 mL | Freq: Once | ORAL | Status: AC
  Administered 2016-09-26: 300 mg via ORAL
  Filled 2016-09-26: qty 15

## 2016-09-26 MED ORDER — PREDNISONE 20 MG PO TABS
60.0000 mg | ORAL_TABLET | Freq: Once | ORAL | Status: AC
  Administered 2016-09-26: 60 mg via ORAL
  Filled 2016-09-26: qty 3

## 2016-09-26 MED ORDER — PREDNISONE 20 MG PO TABS: 40.0000 mg | ORAL_TABLET | Freq: Every day | ORAL | 0 refills | Status: DC

## 2016-09-26 MED ORDER — IPRATROPIUM-ALBUTEROL 0.5-2.5 (3) MG/3ML IN SOLN
3.0000 mL | RESPIRATORY_TRACT | Status: DC
  Administered 2016-09-26 – 2016-09-27 (×7): 3 mL via RESPIRATORY_TRACT
  Filled 2016-09-26 (×8): qty 3

## 2016-09-26 MED ORDER — ONDANSETRON HCL 4 MG/2ML IJ SOLN
4.0000 mg | Freq: Four times a day (QID) | INTRAMUSCULAR | Status: DC | PRN
  Filled 2016-09-26: qty 2

## 2016-09-26 NOTE — ED Notes (Signed)
Pt c/o cough for 2 weeks; has been on 2 different antibiotics and still feeling bad; productive at times of green sputum; shortness of breath, mostly with exertion; pt is about [redacted] weeks pregnant

## 2016-09-26 NOTE — ED Notes (Signed)
OB consult at bedside.

## 2016-09-26 NOTE — ED Provider Notes (Signed)
[facility] Emergency Department Provider Note    First MD Initiated Contact with Patient 09/26/16 [phone]     (approximate)  I have reviewed the triage vital signs and the nursing notes.   HISTORY  Chief Complaint Cough and Shortness of Breath    HPI Brandi Fleming is a 31 y.o. female G1P0 presents with 2 weeks of persistent cough and shortness of breath. Previously treated with Z-Pak and recently started on Augmentin without any significant improvement. States that she's been using her albuterol inhalers without any relief. She does not have any nebulizers or spacer. States that every time she tries to take the albuterol inhaler she just coughs immediately. Has not been on any recent steroids. Chest pain. No lower extremity swelling. Her primary complaint is cough and wanting to get some rest.    Past Medical History:  Diagnosis Date  . Asthma   . Bipolar 1 disorder (HCC)    No family history on file. History reviewed. No pertinent surgical history. Patient Active Problem List   Diagnosis Date Noted  . Bipolar 1 disorder (HCC)       Prior to Admission medications   Medication Sig Start Date End Date Taking? Authorizing Provider  albuterol (PROVENTIL HFA;VENTOLIN HFA) 108 (90 Base) MCG/ACT inhaler Inhale 2 puffs into the lungs every 6 (six) hours as needed for wheezing or shortness of breath. 11/28/15   Tommi Rumpshonda L Summers, PA-C  benzonatate (TESSALON PERLES) 100 MG capsule Take 2 capsules (200 mg total) by mouth 3 (three) times daily as needed for cough. 11/28/15 11/27/16  Tommi Rumpshonda L Summers, PA-C  oxyCODONE-acetaminophen (ROXICET) 5-325 MG tablet Take 1 tablet by mouth every 6 (six) hours as needed. 02/05/16   Jeanmarie PlantJames A McShane, MD  predniSONE (DELTASONE) 10 MG tablet Take 6 tablets  today, on day 2 take 5 tablets, day 3 take 4 tablets, day 4 take 3 tablets, day 5 take  2 tablets and 1 tablet the last day 11/28/15   Tommi Rumpshonda L Summers, PA-C     Allergies Vicodin [hydrocodone-acetaminophen]    Social History Social History  Substance Use Topics  . Smoking status: Former Games developermoker  . Smokeless tobacco: Never Used  . Alcohol use No    Review of Systems Patient denies headaches, rhinorrhea, blurry vision, numbness, shortness of breath, chest pain, edema, cough, abdominal pain, nausea, vomiting, diarrhea, dysuria, fevers, rashes or hallucinations unless otherwise stated above in HPI. ____________________________________________   PHYSICAL EXAM:  VITAL SIGNS: Vitals:   09/26/16 0546  BP: (!) 138/99  Pulse: 97  Resp: 20  Temp: 98.4 F (36.9 C)    Constitutional: Alert and oriented. Well appearing and in no acute distress. Eyes: Conjunctivae are normal. PERRL. EOMI. Head: Atraumatic. Nose: No congestion/rhinnorhea. Mouth/Throat: Mucous membranes are moist.  Oropharynx non-erythematous. Neck: No stridor. Painless ROM. No cervical spine tenderness to palpation Hematological/Lymphatic/Immunilogical: No cervical lymphadenopathy. Cardiovascular: Normal rate, regular rhythm. Grossly normal heart sounds.  Good peripheral circulation. Respiratory: Normal respiratory effort.  No retractions. Lungs CTAB. Gastrointestinal: gravid, Soft and nontender. No distention. No abdominal bruits. No CVA tenderness. Musculoskeletal: No lower extremity tenderness nor edema.  No joint effusions. Neurologic:  Normal speech and language. No gross focal neurologic deficits are appreciated. No gait instability. Skin:  Skin is warm, dry and intact. No rash noted. Psychiatric: Mood and affect are normal. Speech and behavior are normal.  ____________________________________________   LABS (all labs ordered are listed, but only abnormal results are displayed)  Results for orders placed  or performed during the hospital encounter of 09/26/16 (from the past 24 hour(s))  Basic metabolic panel     Status: Abnormal   Collection Time: 09/26/16  6:16  AM  Result Value Ref Range   Sodium 137 135 - 145 mmol/L   Potassium 3.1 (L) 3.5 - 5.1 mmol/L   Chloride 105 101 - 111 mmol/L   CO2 25 22 - 32 mmol/L   Glucose, Bld 107 (H) 65 - 99 mg/dL   BUN 6 6 - 20 mg/dL   Creatinine, Ser 4.09 (L) 0.44 - 1.00 mg/dL   Calcium 8.3 (L) 8.9 - 10.3 mg/dL   GFR calc non Af Amer >60 >60 mL/min   GFR calc Af Amer >60 >60 mL/min   Anion gap 7 5 - 15  CBC     Status: Abnormal   Collection Time: 09/26/16  6:16 AM  Result Value Ref Range   WBC 8.6 3.6 - 11.0 K/uL   RBC 3.76 (L) 3.80 - 5.20 MIL/uL   Hemoglobin 11.9 (L) 12.0 - 16.0 g/dL   HCT 81.1 (L) 91.4 - 78.2 %   MCV 89.1 80.0 - 100.0 fL   MCH 31.7 26.0 - 34.0 pg   MCHC 35.6 32.0 - 36.0 g/dL   RDW 95.6 21.3 - 08.6 %   Platelets 220 150 - 440 K/uL  Troponin I     Status: None   Collection Time: 09/26/16  6:16 AM  Result Value Ref Range   Troponin I <0.03 <0.03 ng/mL  Magnesium     Status: None   Collection Time: 09/26/16  6:16 AM  Result Value Ref Range   Magnesium 1.7 1.7 - 2.4 mg/dL   ____________________________________________  EKG My review and personal interpretation at Time: 6:01   Indication: souch  Rate: 80  Rhythm: sinus Axis: normal Other: no st elevations or depressions, normal intervlaas ____________________________________________  RADIOLOGY  I personally reviewed all radiographic images ordered to evaluate for the above acute complaints and reviewed radiology reports and findings.  These findings were personally discussed with the patient.  Please see medical record for radiology report.  ____________________________________________   PROCEDURES  Procedure(s) performed:  Procedures    Critical Care performed: no ____________________________________________   INITIAL IMPRESSION / ASSESSMENT AND PLAN / ED COURSE  Pertinent labs & imaging results that were available during my care of the patient were reviewed by me and considered in my medical decision making (see  chart for details).  DDX: bronchitis, asthma, pna, uri  Brandi Fleming is a 31 y.o. who presents to the ED with Cough and shortness of breath over the past 2 weeks. Patient arrives afebrile hemodynamic stable. Denies any abdominal pain. As her sharing fetal heart tones. Blood work is otherwise reassuring. Chest x-ray ordered as the patient is been on multiple antibiotic treatments without any significant improvement. No evidence of lobar pneumonia. Further supported by the normal white count. I do suspect that her chief complaint is of underlying bronchospasm given her wheezing. Will provide additional nebulizers as well as steroids. Patient be signed out to Dr. Alphonzo Lemmings.       ____________________________________________   FINAL CLINICAL IMPRESSION(S) / ED DIAGNOSES  Final diagnoses:  Cough      NEW MEDICATIONS STARTED DURING THIS VISIT:  New Prescriptions   No medications on file     Note:  This document was prepared using Dragon voice recognition software and may include unintentional dictation errors.    Willy Eddy, MD 09/26/16 [phone]

## 2016-09-26 NOTE — Progress Notes (Signed)
I went to take Pt ordered Zofran for c/o Nausea and she stated she does not want to take Zofran because she is concerned about the effect this medicine would have on her Baby. Pt. States she has two friends that have Babies with cleft palate and these Mom's had taken Zofran. I offered to call C. Yetta BarreJones CNM and see if she could order something else and Pt. Declines. Pt. Has drank an entire water Pitcher of water and is eating Potato chips. Will cont. To follow closely.

## 2016-09-26 NOTE — ED Notes (Signed)
Patient transported to X-ray via stretcher 

## 2016-09-26 NOTE — H&P (Signed)
ROS: see below History Per below   Blood pressure (!) 143/83, pulse (!) 107, temperature 97.8 F (36.6 C), temperature source Oral, resp. rate 20, height 5\' 2"  (1.575 m), weight 83.9 kg (185 lb), last menstrual period 01/15/2016, SpO2 100 %. Exam Physical Exam see notes Brandi Fleming is a 31 y.o. female presenting for "asthmatic wheezing and SOB and cannot get her meds (albuterol)  From Medicaid. Pt is pregnant and [facility] for unsure dating approx LMP of 03/31/16 & EDD of 6/27/198 and got pregnant on the birth control pills. She is a G2P1101.  Her pregnancy has been complicated by asthma and wheezing x 3 weeks and seen and tx in the office last week with Asthma and tx with Augmentin. Pt was formerly on Albuterol but, could not get refills as prescribed.  Seen in ER today for multiple breathing tx's and nebs which did not clear pt. Currently pulse ox is 98%. Due to 3 weeks on wheezing without recovery, feel it is best to admit and get a resp consult, Telemetry and observe for worsening of oxygen.          OB History    Gravida Para Term Preterm AB Living   1             SAB TAB Ectopic Multiple Live Births                1 Term  07/12/07  39 weeks 2.892 kg (6lb6oz) M NSVD live     Past Medical History:  Diagnosis Date  . Asthma   . Bipolar 1 disorder (HCC)   ZOX:WRUEAVPSH:wisdom teeth FMH: father; Epilepsy in her father; Heart disease in her father and mother; Hepatitis C in her father; High blood pressre (Hypertension) in her father; Lung cancer in her father. ETOH abuse: Dad, Colon cancer: MGM, Dad:DM, WUJ:WJXBad:drug abuse and emphysema Social History: reports that she has been smoking. She has never used smokeless tobacco. She reports that she does not drink alcohol or use drugs. Current Meds: has a current medication list which includes the following prescription(s): prenatal vitamin-iron-fa-dha-docusate.  Allergies: has No Known Allergies. Social History:  reports  that she has quit smoking. She has never used smokeless tobacco. She reports that she has current or past drug history. She reports that she does not drink alcohol.     Maternal Diabetes:  Genetic Screening:  Maternal Ultrasounds/Referrals: Normal Fetal Ultrasounds or other Referrals:  None Maternal Substance Abuse:  No Significant Maternal Medications: Albuterol, PNV Significant Maternal Lab Results:  None Other Comments: Low Potassium and will replace  Review of Systems  Constitutional: Negative.   HENT: Negative.   Eyes: Negative.   Respiratory: Positive for cough, shortness of breath and wheezing.   Gastrointestinal: Negative.   Genitourinary: Negative.   Musculoskeletal: Negative.   Skin: Negative.   Neurological: Negative.   Endo/Heme/Allergies: Negative.   Psychiatric/Behavioral: Negative.    History Blood pressure (!) 136/91, pulse (!) 107, temperature 98.4 F (36.9 C), temperature source Oral, resp. rate (!) 27, height 5\' 2"  (1.575 m), weight 83.9 kg (185 lb), last menstrual period 01/15/2016, SpO2 98 %. Exam Physical Exam  Gen: 31 yo white female in NAD Heart: S1S2, RRR, No M/R/G EKG in ER showed some PVC's and occas irreg R-R Lungs: +exp and inspir wheezes througout, no rales heard Abd: Gravid, +FHR Extrems: no edema, neg Homans Taking po well: 900 in the ER Voiding without difficulty  Prenatal labs: ABO, Rh:  O neg Antibody:  neg Rubella:  Immune RPR:   NR HBsAg:  Neg  HIV:   Neg  GBS: Unknown   Varicella immune Assessment/Plan: A: IUP at 24 weeks with asthma unresolved on Antibotics 2. Cough P: Add Steroids to the lungs per ER MD. 2. Resp consult for nebs 3. Monitor pulse ox and address if SOB gets worse 4. Xray: no S/S of pneumonia 5 Social services to determine if pt can get meds to resolve inpatient admit.   Brandi Fleming 09/26/2016, 12:19 PM

## 2016-09-26 NOTE — ED Triage Notes (Signed)
Patient with complaint of cough and shortness of breath that started about 2 weeks ago. Patient states she has had a z pack and is still on amoxicillin with no improvement. Patient with a history of asthma and has ran out of her inhaler. Patient is [redacted] weeks pregnant.

## 2016-09-26 NOTE — ED Notes (Signed)
Per L&D, pt needs to be seen in ED and if any concerns arise with baby then page on call midwife.

## 2016-09-26 NOTE — Progress Notes (Signed)
LCSW and Care manager [facility] consulted. LCSW met with patient who explained she used all her albuteral inhaler and and her pharmacist at [facility] would not refill her script until next week. Patient was coughing very hard through our brief discussion and struggled to speak in catching her breath. RN came in and reported the patient will be admitted.  This situation was reviewed and patient was advised that her OBGYN could re-write and change the original script and she could then have it re-filled at [facility]. LCSW provided support to patient and reported back to care manager that the patient will be admitted and will follow up with obtaining new scripts from [facility].  No further needs  [employer] LCSW [phone]

## 2016-09-26 NOTE — ED Notes (Signed)
Dr Roxan Hockeyobinson at bedside to evaluate

## 2016-09-26 NOTE — ED Provider Notes (Addendum)
-----------------------------------------   9:41 AM on 09/26/2016 -----------------------------------------  Patient remains with wheeze despite multiple nebulizer treatments here. We will give her another 1 however she is not in respiratory distress there is still a persistent wheeze. I discussed with Dr. Dalbert GarnetBeasley of OB/GYN, who agrees with management and will admit the patient. The patient has great difficulty apparently finding outpatient albuterol which is certainly contributing to problem as has likely unusual management of asthma without steroids as an outpatient. Nonetheless despite multiple nebs she is still wheezing and although she does not seem to be in any danger of needing BiPAP or intubation seen, she is not safe for discharge.CRITICAL CARE Performed by: Jeanmarie PlantJAMES A Sakari Alkhatib   Total critical care time: 40 minutes  Critical care time was exclusive of separately billable procedures and treating other patients.  Critical care was necessary to treat or prevent imminent or life-threatening deterioration.  Critical care was time spent personally by me on the following activities: development of treatment plan with patient and/or surrogate as well as nursing, discussions with consultants, evaluation of patient's response to treatment, examination of patient, obtaining history from patient or surrogate, ordering and performing treatments and interventions, ordering and review of laboratory studies, ordering and review of radiographic studies, pulse oximetry and re-evaluation of patient's condition.  ----------------------------------------- 11:40 AM on 09/26/2016 -----------------------------------------  We have not seen the eveningfor dysuria, we are re-paging him. Patient is again with worsening wheezing but no evidence of decompensation otherwise we will give her another treatment.  Jeanmarie PlantJames A Christyne Mccain, MD 09/26/16 [id number]    Jeanmarie PlantJames A Daimion Adamcik, MD 09/26/16 [phone]

## 2016-09-26 NOTE — ED Notes (Signed)
Given water and crackers. Meal tray ordered

## 2016-09-26 NOTE — ED Notes (Signed)
Pt remains to have expiratory wheezing worse on left

## 2016-09-26 NOTE — ED Notes (Addendum)
Pt is alert and oriented, coughing has improved since medication. No respiratory distress. Wheezing present

## 2016-09-27 MED ORDER — PROMETHAZINE HCL 25 MG RE SUPP: 25.0000 mg | Freq: Four times a day (QID) | RECTAL | Status: DC | PRN

## 2016-09-27 MED ORDER — KCL-LACTATED RINGERS 20 MEQ/L IV SOLN
INTRAVENOUS | Status: DC
  Administered 2016-09-27: 05:00:00 via INTRAVENOUS
  Filled 2016-09-27 (×5): qty 1000

## 2016-09-27 MED ORDER — HYDROCOD POLST-CPM POLST ER 10-8 MG/5ML PO SUER
5.0000 mL | Freq: Two times a day (BID) | ORAL | Status: DC
  Administered 2016-09-27: 5 mL via ORAL
  Filled 2016-09-27: qty 5

## 2016-09-27 MED ORDER — PREDNISONE 10 MG (21) PO TBPK
20.0000 mg | ORAL_TABLET | Freq: Every morning | ORAL | Status: DC
Start: 2016-09-27 — End: 2016-09-27
  Filled 2016-09-27: qty 21

## 2016-09-27 MED ORDER — PREDNISONE 10 MG (21) PO TBPK: 10.0000 mg | ORAL_TABLET | Freq: Three times a day (TID) | ORAL | Status: DC

## 2016-09-27 MED ORDER — IPRATROPIUM-ALBUTEROL 0.5-2.5 (3) MG/3ML IN SOLN: 3.0000 mL | RESPIRATORY_TRACT | Status: DC | PRN

## 2016-09-27 MED ORDER — PREDNISONE 10 MG (21) PO TBPK: 10.0000 mg | ORAL_TABLET | ORAL | Status: DC

## 2016-09-27 MED ORDER — SODIUM CHLORIDE 0.9% FLUSH
3.0000 mL | Freq: Three times a day (TID) | INTRAVENOUS | Status: DC
  Administered 2016-09-27: 3 mL via INTRAVENOUS

## 2016-09-27 MED ORDER — PREDNISONE 20 MG PO TABS: 30.0000 mg | ORAL_TABLET | Freq: Once | ORAL | Status: DC

## 2016-09-27 MED ORDER — DIPHENHYDRAMINE HCL 12.5 MG/5ML PO ELIX
6.2500 mg | ORAL_SOLUTION | Freq: Four times a day (QID) | ORAL | Status: DC | PRN
  Filled 2016-09-27: qty 5

## 2016-09-27 MED ORDER — PREDNISONE 50 MG PO TABS
50.0000 mg | ORAL_TABLET | Freq: Once | ORAL | Status: AC
  Administered 2016-09-28: 50 mg via ORAL
  Filled 2016-09-27: qty 1

## 2016-09-27 MED ORDER — PREDNISONE 10 MG (21) PO TBPK: 20.0000 mg | ORAL_TABLET | Freq: Every evening | ORAL | Status: DC

## 2016-09-27 MED ORDER — HYDROCOD POLST-CPM POLST ER 10-8 MG/5ML PO SUER
5.0000 mL | Freq: Two times a day (BID) | ORAL | Status: DC
  Administered 2016-09-27 – 2016-09-28 (×3): 5 mL via ORAL
  Filled 2016-09-27 (×3): qty 5

## 2016-09-27 MED ORDER — PREDNISONE 10 MG (21) PO TBPK
10.0000 mg | ORAL_TABLET | ORAL | Status: DC
Start: 2016-09-27 — End: 2016-09-27

## 2016-09-27 MED ORDER — PREDNISONE 50 MG PO TABS
60.0000 mg | ORAL_TABLET | Freq: Once | ORAL | Status: AC
  Administered 2016-09-27: 15:00:00 60 mg via ORAL
  Filled 2016-09-27: qty 1

## 2016-09-27 MED ORDER — GUAIFENESIN 100 MG/5ML PO SOLN
10.0000 mL | Freq: Four times a day (QID) | ORAL | Status: DC
  Administered 2016-09-27 – 2016-09-28 (×4): 200 mg via ORAL
  Filled 2016-09-27 (×7): qty 10

## 2016-09-27 MED ORDER — PREDNISONE 10 MG (21) PO TBPK: 10.0000 mg | ORAL_TABLET | Freq: Four times a day (QID) | ORAL | Status: DC

## 2016-09-27 MED ORDER — PREDNISONE 20 MG PO TABS: 20.0000 mg | ORAL_TABLET | Freq: Once | ORAL | Status: DC

## 2016-09-27 MED ORDER — PREDNISONE 10 MG PO TABS: 10.0000 mg | ORAL_TABLET | Freq: Once | ORAL | Status: DC

## 2016-09-27 MED ORDER — PREDNISONE 20 MG PO TABS: 40.0000 mg | ORAL_TABLET | Freq: Once | ORAL | Status: DC

## 2016-09-27 NOTE — Progress Notes (Signed)
Patient ID: Brandi Fleming, female   DOB: 05/01/1986, 31 y.o.   MRN: [id number] Prednisone taper discussed with pharmacy and order to be linked is very complex and could not be added. So, a Sterapred pack is being given to eliminate any inaccurate ordering in EPIC.

## 2016-09-27 NOTE — Progress Notes (Signed)
Patient ID: Brandi Fleming, female   DOB: 10/18/1985, 31 y.o.   MRN: [id number] Called due to pt coughing non-stop and cannot sleep. Sats run low occas with frequent coughing. Will give Tussionex so pt can sleep. Will add very small amt of Benadryl 6.25mg  elixir vs 25 mg to offset itching as Benadryl may sedate pt further causing hypoxxa. Pt gets itching from hydrocodone but, opts to take it. Per nursing, pt still has wheezing and could not take her potassium due to nausea. Will switch pt to IV. Pt refused the Zofran due to poss of birth defects and cleft palate.

## 2016-09-27 NOTE — Progress Notes (Addendum)
Has rested well since 0433 dose of Tussionex. Appears to be sleeping with RR even and unlabored. Color good. Skin w&d. HR is reg. O2 Sat. Has been in the high 90's this entire shift. Fht's have been 154-164 with good Fetal movement. Pt. Has not had any vaginal discharge or bleeding. Appears comfortable and in NAD.

## 2016-09-27 NOTE — Progress Notes (Signed)
Subjective:HD #2 for SOB and cough . Pt is on nebulizer treatments and prednisone and augmentin .  Patient reports  States that she does fell much better . Phlegm is difficult to mobilze.  tussinex is helping  Good fetal movt  Objective: I have reviewed patient's vital signs. 121/91 , p= 89 , rr =20  Pulse ox 99-100 % room air  Exam :  Pt is able to speak full sentences without distress Lungs course rhonchi bilateral  Nl Breath sounds bilat CV RRR  adb soft nt   Assessment/Plan: Bronchitis  Clinical stable  Cont abx , add inspiratory flutter by RT  Add robitussin  Cont Neb tx by RT  Cont Prednisone  LOS: 1 day    SCHERMERHORN,THOMAS 09/27/2016, 12:23 PM

## 2016-09-27 NOTE — Progress Notes (Signed)
I called Yetta Barre. Jones CNM back after she ordered Tussionex for Pt.'s cough that has kept the Pt. From resting  tonight. I expressed concern over Pt. Having allergy to Vicodin that caused itching in the past. C. Jones states that she has ordered a small dose of Benadryl for the Pt. In the event she has itching as a side effect. I informed the Pt. That because she is allergic to Vicodin, the Tussionex might also cause her to itch. Pt. V/O and requests that the Medicine be given. Beatriz Stallion. Jones also stated [facility], there would not be any effect on the Fetuses development. Will cont. To monitor closely.

## 2016-09-28 LAB — HIV ANTIBODY (ROUTINE TESTING W REFLEX): HIV SCREEN 4TH GENERATION: NONREACTIVE

## 2016-09-28 MED ORDER — IPRATROPIUM-ALBUTEROL 0.5-2.5 (3) MG/3ML IN SOLN: 3.0000 mL | Freq: Four times a day (QID) | RESPIRATORY_TRACT | 1 refills | Status: DC

## 2016-09-28 MED ORDER — GUAIFENESIN 100 MG/5ML PO SOLN: 10.0000 mL | Freq: Four times a day (QID) | ORAL | 0 refills | Status: DC

## 2016-09-28 MED ORDER — PREDNISONE 10 MG (21) PO TBPK: ORAL_TABLET | ORAL | 0 refills | Status: DC

## 2016-09-28 MED ORDER — BENZONATATE 100 MG PO CAPS: 100.0000 mg | ORAL_CAPSULE | Freq: Three times a day (TID) | ORAL | 1 refills | Status: DC | PRN

## 2016-09-28 NOTE — Progress Notes (Signed)
Pt discharged home.  Discharge instructions, prescriptions and follow up appointment given to and reviewed with pt.  Pt verbalized understanding.  Escorted by auxillary. 

## 2016-09-28 NOTE — Discharge Summary (Signed)
Physician Discharge Summary  Patient ID: ACCALIA Fleming MRN: [id number] DOB/AGE: 1985/07/29 31 y.o.  Admit date: 09/26/2016 Discharge date: 09/28/2016  Admission Diagnoses:SOB , bronchitis , asthma  Discharge Diagnoses: same  Active Problems:   Asthma affecting pregnancy, antepartum   Discharged Condition: good  Hospital Course: pt was admittted and started on Nebulizer treatments , prednisone steroid taper , tessalon , robitussion . CXR negative . Inspiratory flutter with RT assist.  DOD , afebrile . And LUNG exam NL   Consults: RT  Significant Diagnostic Studies: labs: Results for orders placed or performed during the hospital encounter of 09/26/16 (from the past 72 hour(s))  Basic metabolic panel     Status: Abnormal   Collection Time: 09/26/16  6:16 AM  Result Value Ref Range   Sodium 137 135 - 145 mmol/L   Potassium 3.1 (L) 3.5 - 5.1 mmol/L   Chloride 105 101 - 111 mmol/L   CO2 25 22 - 32 mmol/L   Glucose, Bld 107 (H) 65 - 99 mg/dL   BUN 6 6 - 20 mg/dL   Creatinine, Ser 0.40 (L) 0.44 - 1.00 mg/dL   Calcium 8.3 (L) 8.9 - 10.3 mg/dL   GFR calc non Af Amer >60 >60 mL/min   GFR calc Af Amer >60 >60 mL/min    Comment: (NOTE) The eGFR has been calculated using the CKD EPI equation. This calculation has not been validated in all clinical situations. eGFR's persistently <60 mL/min signify possible Chronic Kidney Disease.    Anion gap 7 5 - 15  CBC     Status: Abnormal   Collection Time: 09/26/16  6:16 AM  Result Value Ref Range   WBC 8.6 3.6 - 11.0 K/uL   RBC 3.76 (L) 3.80 - 5.20 MIL/uL   Hemoglobin 11.9 (L) 12.0 - 16.0 g/dL   HCT 33.5 (L) 35.0 - 47.0 %   MCV 89.1 80.0 - 100.0 fL   MCH 31.7 26.0 - 34.0 pg   MCHC 35.6 32.0 - 36.0 g/dL   RDW 12.8 11.5 - 14.5 %   Platelets 220 150 - 440 K/uL  Troponin I     Status: None   Collection Time: 09/26/16  6:16 AM  Result Value Ref Range   Troponin I <0.03 <0.03 ng/mL  Magnesium     Status: None   Collection Time:  09/26/16  6:16 AM  Result Value Ref Range   Magnesium 1.7 1.7 - 2.4 mg/dL  HIV antibody (Routine Testing)     Status: None   Collection Time: 09/26/16 12:18 PM  Result Value Ref Range   HIV Screen 4th Generation wRfx Non Reactive Non Reactive    Comment: (NOTE) Performed At: [facility] [place], [place] [id number] Lindon Romp MD LA:[id number]    and radiology: CXR  Treatments: surgery: RT neb tx  And flutter   Discharge Exam: Blood pressure 120/71, pulse 68, temperature 97.8 F (36.6 C), temperature source Oral, resp. rate 18, height '5\' 2"'$  (1.575 m), weight 185 lb (83.9 kg), last menstrual period 01/15/2016, SpO2 97 %. Lungs CTA  CV RRR  ADb  Soft NT   Disposition: 01-Home or Self Care  Discharge Instructions    Call MD for:    Complete by:  As directed    SOB , excessive coughing   Call MD for:    Complete by:  As directed    For SOB , excessive coughing   Call MD for:  difficulty breathing, headache or visual  disturbances    Complete by:  As directed    Call MD for:  extreme fatigue    Complete by:  As directed    Call MD for:  hives    Complete by:  As directed    Call MD for:  persistant dizziness or light-headedness    Complete by:  As directed    Call MD for:  persistant nausea and vomiting    Complete by:  As directed    Call MD for:  redness, tenderness, or signs of infection (pain, swelling, redness, odor or green/yellow discharge around incision site)    Complete by:  As directed    Call MD for:  severe uncontrolled pain    Complete by:  As directed    Call MD for:  temperature >100.4    Complete by:  As directed    Diet - low sodium heart healthy    Complete by:  As directed    Diet - low sodium heart healthy    Complete by:  As directed    Increase activity slowly    Complete by:  As directed    Increase activity slowly    Complete by:  As directed      Allergies as of 09/28/2016      Reactions   Vicodin  [hydrocodone-acetaminophen] Itching      Medication List    STOP taking these medications   oxyCODONE-acetaminophen 5-325 MG tablet Commonly known as:  ROXICET     TAKE these medications   albuterol 108 (90 Base) MCG/ACT inhaler Commonly known as:  PROVENTIL HFA;VENTOLIN HFA Inhale 2 puffs into the lungs every 6 (six) hours as needed for wheezing or shortness of breath. What changed:  Another medication with the same name was added. Make sure you understand how and when to take each.   albuterol 108 (90 Base) MCG/ACT inhaler Commonly known as:  PROVENTIL HFA;VENTOLIN HFA Inhale 2 puffs into the lungs every 6 (six) hours as needed for wheezing or shortness of breath. What changed:  You were already taking a medication with the same name, and this prescription was added. Make sure you understand how and when to take each.   amoxicillin-clavulanate 500-125 MG tablet Commonly known as:  AUGMENTIN Take 1 tablet by mouth 2 (two) times daily.   benzonatate 100 MG capsule Commonly known as:  TESSALON PERLES Take 2 capsules (200 mg total) by mouth 3 (three) times daily as needed for cough. What changed:  Another medication with the same name was added. Make sure you understand how and when to take each.   benzonatate 100 MG capsule Commonly known as:  TESSALON PERLES Take 1 capsule (100 mg total) by mouth 3 (three) times daily as needed for cough. What changed:  You were already taking a medication with the same name, and this prescription was added. Make sure you understand how and when to take each.   guaiFENesin 100 MG/5ML Soln Commonly known as:  ROBITUSSIN Take 10 mLs (200 mg total) by mouth 4 (four) times daily.   ipratropium-albuterol 0.5-2.5 (3) MG/3ML Soln Commonly known as:  DUONEB Take 3 mLs by nebulization 4 (four) times daily.   predniSONE 10 MG tablet Commonly known as:  DELTASONE Take 6 tablets  today, on day 2 take 5 tablets, day 3 take 4 tablets, day 4 take 3  tablets, day 5 take  2 tablets and 1 tablet the last day What changed:  Another medication with the same name was  added. Make sure you understand how and when to take each.   predniSONE 20 MG tablet Commonly known as:  DELTASONE Take 2 tablets (40 mg total) by mouth daily. What changed:  You were already taking a medication with the same name, and this prescription was added. Make sure you understand how and when to take each.   predniSONE 10 MG (21) Tbpk tablet Commonly known as:  STERAPRED UNI-PAK 21 TAB Daily po dose 40 mg , 30 mg , 20 mg , 10 mg for 4 days What changed:  You were already taking a medication with the same name, and this prescription was added. Make sure you understand how and when to take each.   PRENATAL PO Take 1 tablet by mouth daily.      Follow-up Information    Leaner Morici, MD. Go on 10/06/2016.   Specialty:  Obstetrics and Gynecology Why:  Follow up with Dr. Ouida Sills on 10/06/16 @ 2:15 pm Contact information: [address] [place] [address] [phone]           Signed: Laverta Baltimore 09/28/2016, 10:00 AM

## 2016-09-28 NOTE — Discharge Instructions (Signed)

## 2016-10-22 ENCOUNTER — Inpatient Hospital Stay
Admission: EM | Admit: 2016-10-22 | Discharge: 2016-10-24 | DRG: 781 | Disposition: A | Discharge status: Home / Self Care | Payer: Medicaid Other | Attending: Obstetrics and Gynecology | Admitting: Obstetrics and Gynecology

## 2016-10-22 ENCOUNTER — Encounter: Payer: Self-pay | Admitting: Emergency Medicine

## 2016-10-22 ENCOUNTER — Emergency Department: Payer: Medicaid Other

## 2016-10-22 DIAGNOSIS — Z6791 Unspecified blood type, Rh negative: Secondary | ICD-10-CM | POA: Diagnosis not present

## 2016-10-22 DIAGNOSIS — O99513 Diseases of the respiratory system complicating pregnancy, third trimester: Principal | ICD-10-CM | POA: Diagnosis present

## 2016-10-22 DIAGNOSIS — F319 Bipolar disorder, unspecified: Secondary | ICD-10-CM | POA: Diagnosis present

## 2016-10-22 DIAGNOSIS — J45901 Unspecified asthma with (acute) exacerbation: Secondary | ICD-10-CM | POA: Diagnosis present

## 2016-10-22 DIAGNOSIS — R0602 Shortness of breath: Secondary | ICD-10-CM | POA: Diagnosis present

## 2016-10-22 DIAGNOSIS — J45909 Unspecified asthma, uncomplicated: Secondary | ICD-10-CM | POA: Diagnosis present

## 2016-10-22 DIAGNOSIS — E871 Hypo-osmolality and hyponatremia: Secondary | ICD-10-CM | POA: Diagnosis present

## 2016-10-22 DIAGNOSIS — Z3A29 29 weeks gestation of pregnancy: Secondary | ICD-10-CM

## 2016-10-22 DIAGNOSIS — Z87891 Personal history of nicotine dependence: Secondary | ICD-10-CM | POA: Diagnosis not present

## 2016-10-22 DIAGNOSIS — O99891 Other specified diseases and conditions complicating pregnancy: Secondary | ICD-10-CM

## 2016-10-22 DIAGNOSIS — O99343 Other mental disorders complicating pregnancy, third trimester: Secondary | ICD-10-CM | POA: Diagnosis present

## 2016-10-22 DIAGNOSIS — E876 Hypokalemia: Secondary | ICD-10-CM | POA: Diagnosis present

## 2016-10-22 DIAGNOSIS — O9989 Other specified diseases and conditions complicating pregnancy, childbirth and the puerperium: Secondary | ICD-10-CM

## 2016-10-22 DIAGNOSIS — O99519 Diseases of the respiratory system complicating pregnancy, unspecified trimester: Secondary | ICD-10-CM

## 2016-10-22 DIAGNOSIS — O26893 Other specified pregnancy related conditions, third trimester: Secondary | ICD-10-CM | POA: Diagnosis present

## 2016-10-22 DIAGNOSIS — J4541 Moderate persistent asthma with (acute) exacerbation: Secondary | ICD-10-CM

## 2016-10-22 LAB — CBC WITH DIFFERENTIAL/PLATELET
BASOS ABS: 0 10*3/uL (ref 0–0.1)
Basophils Relative: 0 %
Eosinophils Absolute: 0 10*3/uL (ref 0–0.7)
Eosinophils Relative: 0 %
HEMATOCRIT: 33.8 % — AB (ref 35.0–47.0)
Hemoglobin: 11.9 g/dL — ABNORMAL LOW (ref 12.0–16.0)
Lymphocytes Relative: 4 %
Lymphs Abs: 0.4 10*3/uL — ABNORMAL LOW (ref 1.0–3.6)
MCH: 31.3 pg (ref 26.0–34.0)
MCHC: 35.1 g/dL (ref 32.0–36.0)
MCV: 89.2 fL (ref 80.0–100.0)
MONO ABS: 0.5 10*3/uL (ref 0.2–0.9)
Monocytes Relative: 5 %
NEUTROS ABS: 9.6 10*3/uL — AB (ref 1.4–6.5)
Neutrophils Relative %: 91 %
Platelets: 176 10*3/uL (ref 150–440)
RBC: 3.79 MIL/uL — ABNORMAL LOW (ref 3.80–5.20)
RDW: 13 % (ref 11.5–14.5)
WBC: 10.5 10*3/uL (ref 3.6–11.0)

## 2016-10-22 LAB — BASIC METABOLIC PANEL
Anion gap: 7 (ref 5–15)
BUN: <5 mg/dL — ABNORMAL LOW (ref 6–20)
CALCIUM: 8 mg/dL — AB (ref 8.9–10.3)
CO2: 23 mmol/L (ref 22–32)
Chloride: 102 mmol/L (ref 101–111)
Creatinine, Ser: <0.3 mg/dL — ABNORMAL LOW (ref 0.44–1.00)
GLUCOSE: 89 mg/dL (ref 65–99)
Potassium: 2.9 mmol/L — ABNORMAL LOW (ref 3.5–5.1)
Sodium: 132 mmol/L — ABNORMAL LOW (ref 135–145)

## 2016-10-22 LAB — HEPATIC FUNCTION PANEL
ALK PHOS: 60 U/L (ref 38–126)
ALT: 14 U/L (ref 14–54)
AST: 19 U/L (ref 15–41)
Albumin: 3 g/dL — ABNORMAL LOW (ref 3.5–5.0)
BILIRUBIN TOTAL: 1.3 mg/dL — AB (ref 0.3–1.2)
Bilirubin, Direct: 0.2 mg/dL (ref 0.1–0.5)
Indirect Bilirubin: 1.1 mg/dL — ABNORMAL HIGH (ref 0.3–0.9)
Total Protein: 6 g/dL — ABNORMAL LOW (ref 6.5–8.1)

## 2016-10-22 LAB — TROPONIN I

## 2016-10-22 LAB — BRAIN NATRIURETIC PEPTIDE: B Natriuretic Peptide: 64 pg/mL (ref 0.0–100.0)

## 2016-10-22 MED ORDER — SODIUM CHLORIDE 0.9 % IV BOLUS (SEPSIS)
1000.0000 mL | Freq: Once | INTRAVENOUS | Status: DC
Start: 2016-10-22 — End: 2016-10-22

## 2016-10-22 MED ORDER — SODIUM CHLORIDE 0.9 % IV BOLUS (SEPSIS)
1000.0000 mL | Freq: Once | INTRAVENOUS | Status: AC
  Administered 2016-10-22: 1000 mL via INTRAVENOUS

## 2016-10-22 MED ORDER — DOCUSATE SODIUM 100 MG PO CAPS
100.0000 mg | ORAL_CAPSULE | Freq: Every day | ORAL | Status: DC
  Administered 2016-10-23 – 2016-10-24 (×2): 100 mg via ORAL
  Filled 2016-10-22 (×2): qty 1

## 2016-10-22 MED ORDER — IPRATROPIUM-ALBUTEROL 0.5-2.5 (3) MG/3ML IN SOLN
3.0000 mL | Freq: Once | RESPIRATORY_TRACT | Status: AC
  Administered 2016-10-22: 3 mL via RESPIRATORY_TRACT
  Filled 2016-10-22: qty 3

## 2016-10-22 MED ORDER — CALCIUM CARBONATE ANTACID 500 MG PO CHEW
2.0000 | CHEWABLE_TABLET | ORAL | Status: DC | PRN
Start: 2016-10-22 — End: 2016-10-24
  Administered 2016-10-23 – 2016-10-24 (×5): 400 mg via ORAL
  Filled 2016-10-22 (×5): qty 2

## 2016-10-22 MED ORDER — PRENATAL MULTIVITAMIN CH
1.0000 | ORAL_TABLET | Freq: Every day | ORAL | Status: DC
  Administered 2016-10-23 – 2016-10-24 (×2): 1 via ORAL
  Filled 2016-10-22 (×2): qty 1

## 2016-10-22 MED ORDER — PROPOFOL 10 MG/ML IV BOLUS: 80.0000 mg | Freq: Once | INTRAVENOUS | Status: DC

## 2016-10-22 MED ORDER — METHYLPREDNISOLONE SODIUM SUCC 125 MG IJ SOLR
125.0000 mg | Freq: Once | INTRAMUSCULAR | Status: AC
  Administered 2016-10-22: 125 mg via INTRAVENOUS
  Filled 2016-10-22: qty 2

## 2016-10-22 MED ORDER — ONDANSETRON HCL 4 MG/2ML IJ SOLN: 4.0000 mg | Freq: Four times a day (QID) | INTRAMUSCULAR | Status: DC | PRN

## 2016-10-22 MED ORDER — MAGNESIUM SULFATE 2 GM/50ML IV SOLN
2.0000 g | Freq: Once | INTRAVENOUS | Status: AC
  Administered 2016-10-22: 2 g via INTRAVENOUS
  Filled 2016-10-22: qty 50

## 2016-10-22 MED ORDER — ZOLPIDEM TARTRATE 5 MG PO TABS: 5.0000 mg | ORAL_TABLET | Freq: Every evening | ORAL | Status: DC | PRN

## 2016-10-22 MED ORDER — ACETAMINOPHEN 325 MG PO TABS: 650.0000 mg | ORAL_TABLET | ORAL | Status: DC | PRN

## 2016-10-22 MED ORDER — POTASSIUM CHLORIDE CRYS ER 20 MEQ PO TBCR
20.0000 meq | EXTENDED_RELEASE_TABLET | Freq: Three times a day (TID) | ORAL | Status: AC
Start: 2016-10-22 — End: 2016-10-24
  Administered 2016-10-22 – 2016-10-24 (×6): 20 meq via ORAL
  Filled 2016-10-22 (×6): qty 1

## 2016-10-22 MED ORDER — IPRATROPIUM-ALBUTEROL 0.5-2.5 (3) MG/3ML IN SOLN
3.0000 mL | Freq: Once | RESPIRATORY_TRACT | Status: AC
Start: 2016-10-22 — End: 2016-10-22
  Administered 2016-10-22: 3 mL via RESPIRATORY_TRACT
  Filled 2016-10-22: qty 3

## 2016-10-22 NOTE — ED Notes (Signed)
Call made to admitting doctor on bed placement.  Admitting doctor returned call and orders put in.

## 2016-10-22 NOTE — ED Notes (Signed)
Pt. Had friend who came to room with food.

## 2016-10-22 NOTE — H&P (Signed)
Brandi Fleming is a 31 y.o. femaleG2P1 presenting for worsening SOB  And wheezing . [redacted] weeks EGA  . Pt was previously admitted 3 weeks ago and underwent tx with inhaler, ABX and steroids . Pt initially improved but now is still with persistent cough .   Problems in this pregnancy Factors complicating this pregnancy   Hx. Of Gestational HTN with last pregnancy  Obesity BMI 33  RH Negative   Social: FOB in jail, patient anxious/depressed about this. Has hx of bipolar but never taken meds for it.  Asthma - using Albuterol inhaler   persistent cough , hospitalization 09/2016  [facility] 10/22/16   Screening results and needs:  NOB:   MBT O- Ab screen Neg Pap HIV Neg Hep B/RPR Neg/NR  Rubella Immune VZV Immune PIH labs normal, GC/C neg, Early glucola 105 Past Medical History:  Diagnosis Date  . Asthma   . Bipolar 1 disorder (HCC)    History reviewed. No pertinent surgical history. Family History: family history is not on file. Social History:  reports that she has quit smoking. She has never used smokeless tobacco. She reports that she has current or past drug history. She reports that she does not drink alcohol.     Maternal Diabetes: not done Genetic Screening: too late Maternal Ultrasounds/Referrals: not done Fetal Ultrasounds or other Referrals: not doneMaternal Substance Abuse:  None documented Significant Maternal Medications:  albuterolSignificant Maternal Lab Results:  Recent Hypokalemia Other Comments: CXR negative 10/22/16  ROS ROS : pyschiatry : bipolar d/c  General : obesity Piulmonary : asthma with RAD requiring steroids  Cardiology: h/o gestational hypertension  Endocrine : no thyroid d/o  Genitourinary : no STD  m/s : nohx muscle weakness Neurologic : no seizure   History   Blood pressure (!) 99/45, pulse (!) 123, temperature 99.1 F (37.3 C), temperature source Oral, resp. rate (!) 21, height  (1.575 m), weight 88.5 kg (195 lb), last menstrual period  01/15/2016, SpO2 97 %. Exam  HEENT normocephalic Perrla Neuro: CN intact  Lungs: + wheezes , BS decreased at bases CV tachycardia without murmur ADb: soft NT + bs , gravid . FHT : 140 at 1400 today Pelvic deferred M/S strength normal throughout  Physical Exam  Prenatal labs: ABO, RhO neg Antibody: Neg Rubella:imm RPR: neg HBsAg:neg HIV: Non Reactive (03/18 1218)  GBS:not done  Assessment/Plan: Worsening RAD , sob and cough  No evidence of pneumonia  Hypokalemia Admission and consult Hospitalist service to manage pulmonary issues  Dru Primeau 10/22/2016, 7:55 PM

## 2016-10-22 NOTE — ED Provider Notes (Signed)
[facility] Emergency Department Provider Note  ____________________________________________   First MD Initiated Contact with Patient 10/22/16 1634     (approximate)  I have reviewed the triage vital signs and the nursing notes.   HISTORY  Chief Complaint Shortness of Breath    HPI Brandi Fleming is a 31 y.o. female who comes to the emergency department with 2 months of continual shortness of breath. She is a past medical history of asthma diagnosed 2 years ago but is never been intubated. She says that for the past 2 months or so she's had continual wheezing and shortness of breath and it has not ever gone away completely. 2 weeks ago she was admitted to the hospital for status asthmaticus and completed a course of prednisone but her symptoms never went away completely. Today she presented to her OB gynecologist for routine follow-up as she is [redacted] weeks pregnant and he advised her to come to the emergency department for further treatment. She denies fevers or chills. She does not smoke. She's had no leg swelling. She sleeps on several pillows but that has not changed.   Past Medical History:  Diagnosis Date  . Asthma   . Bipolar 1 disorder [facility])     Patient Active Problem List   Diagnosis Date Noted  . Asthma affecting pregnancy, antepartum 09/26/2016  . Bipolar 1 disorder (HCC)     History reviewed. No pertinent surgical history.  Prior to Admission medications   Medication Sig Start Date End Date Taking? Authorizing Provider  albuterol (PROVENTIL HFA;VENTOLIN HFA) 108 (90 Base) MCG/ACT inhaler Inhale 2 puffs into the lungs every 6 (six) hours as needed for wheezing or shortness of breath. 09/26/16  Yes Willy Eddy, MD  guaiFENesin (ROBITUSSIN) 100 MG/5ML SOLN Take 10 mLs (200 mg total) by mouth 4 (four) times daily. 09/28/16  Yes Ihor Austin Schermerhorn, MD  ipratropium-albuterol (DUONEB) 0.5-2.5 (3) MG/3ML SOLN Take 3 mLs by nebulization 4  (four) times daily. 09/28/16  Yes Ihor Austin Schermerhorn, MD  predniSONE (STERAPRED UNI-PAK 21 TAB) 10 MG (21) TBPK tablet Daily po dose 40 mg , 30 mg , 20 mg , 10 mg for 4 days 09/28/16  Yes Suzy Bouchard, MD  Prenatal Vit-Fe Fumarate-FA (PRENATAL PO) Take 1 tablet by mouth daily.   Yes Historical Provider, MD  albuterol (PROVENTIL HFA;VENTOLIN HFA) 108 (90 Base) MCG/ACT inhaler Inhale 2 puffs into the lungs every 6 (six) hours as needed for wheezing or shortness of breath. 11/28/15   Tommi Rumps, PA-C  benzonatate (TESSALON PERLES) 100 MG capsule Take 2 capsules (200 mg total) by mouth 3 (three) times daily as needed for cough. Patient not taking: Reported on 09/26/2016 11/28/15 11/27/16  Tommi Rumps, PA-C  benzonatate (TESSALON PERLES) 100 MG capsule Take 1 capsule (100 mg total) by mouth 3 (three) times daily as needed for cough. 09/28/16   Ihor Austin Schermerhorn, MD  predniSONE (DELTASONE) 10 MG tablet Take 6 tablets  today, on day 2 take 5 tablets, day 3 take 4 tablets, day 4 take 3 tablets, day 5 take  2 tablets and 1 tablet the last day Patient not taking: Reported on 09/26/2016 11/28/15   Tommi Rumps, PA-C  predniSONE (DELTASONE) 20 MG tablet Take 2 tablets (40 mg total) by mouth daily. 09/26/16 10/01/16  Willy Eddy, MD    Allergies Vicodin [hydrocodone-acetaminophen]  History reviewed. No pertinent family history.  Social History Social History  Substance Use Topics  . Smoking status:  Former Smoker  . Smokeless tobacco: Never Used  . Alcohol use No    Review of Systems Constitutional: No fever/chills Eyes: No visual changes. ENT: No sore throat. Cardiovascular: Denies chest pain. Respiratory: Positive shortness of breath. Gastrointestinal: No abdominal pain.  No nausea, no vomiting.  No diarrhea.  No constipation. Genitourinary: Negative for dysuria. Musculoskeletal: Negative for back pain. Skin: Negative for rash. Neurological: Negative for headaches,  focal weakness or numbness.  10-point ROS otherwise negative.  ____________________________________________   PHYSICAL EXAM:  VITAL SIGNS: ED Triage Vitals  Enc Vitals Group     BP 10/22/16 1439 119/73     Pulse Rate 10/22/16 1430 (!) 112     Resp 10/22/16 1430 20     Temp 10/22/16 1439 99.1 F (37.3 C)     Temp Source 10/22/16 1439 Oral     SpO2 10/22/16 1430 96 %     Weight 10/22/16 1440 195 lb (88.5 kg)     Height 10/22/16 1440  (1.575 m)     Head Circumference --      Peak Flow --      Pain Score 10/22/16 1427 0     Pain Loc --      Pain Edu? --      Excl. in GC? --    Constitutional: Alert and oriented x 4 well appearing nontoxic no diaphoresis speaks in full, clear sentences Eyes: PERRL EOMI. Head: Atraumatic. Nose: No congestion/rhinnorhea. Mouth/Throat: No trismus Neck: No stridor.  Able to lie completely flat with no jugular venous distention Cardiovascular: Tachycardic rate, regular rhythm. Grossly normal heart sounds.  Good peripheral circulation. Respiratory: Increased respiratory effort with expiratory wheezes in all fields and prolonged expiratory phase Gastrointestinal: Soft nondistended nontender no rebound no guarding no peritonitis no McBurney's tenderness negative Rovsing's no costovertebral tenderness negative Murphy's Musculoskeletal: No lower extremity edema   Neurologic:  Normal speech and language. No gross focal neurologic deficits are appreciated. Skin:  Skin is warm, dry and intact. No rash noted. Psychiatric: Mood and affect are normal. Speech and behavior are normal.    ____________________________________________   DIFFERENTIAL  Asthma exacerbation, pneumonia, congestive heart failure, pulmonary embolism ____________________________________________   LABS (all labs ordered are listed, but only abnormal results are displayed)  Labs Reviewed  BASIC METABOLIC PANEL - Abnormal; Notable for the following:       Result Value    Sodium 132 (*)    Potassium 2.9 (*)    BUN <5 (*)    Creatinine, Ser <0.30 (*)    Calcium 8.0 (*)    All other components within normal limits  HEPATIC FUNCTION PANEL - Abnormal; Notable for the following:    Total Protein 6.0 (*)    Albumin 3.0 (*)    Total Bilirubin 1.3 (*)    Indirect Bilirubin 1.1 (*)    All other components within normal limits  CBC WITH DIFFERENTIAL/PLATELET - Abnormal; Notable for the following:    RBC 3.79 (*)    Hemoglobin 11.9 (*)    HCT 33.8 (*)    Neutro Abs 9.6 (*)    Lymphs Abs 0.4 (*)    All other components within normal limits  BLOOD GAS, VENOUS - Abnormal; Notable for the following:    pH, Ven 7.44 (*)    pCO2, Ven 35 (*)    All other components within normal limits  BRAIN NATRIURETIC PEPTIDE  TROPONIN I    Normal BNP and troponin are reassuring that she does not have heart  failure __________________________________________  EKG  ED ECG REPORT I, Merrily Brittle, the attending physician, personally viewed and interpreted this ECG.  Date: 10/22/2016 Rate: 114 Rhythm: Sinus tachycardia QRS Axis: normal Intervals: normal ST/T Wave abnormalities: normal Conduction Disturbances: none Narrative Interpretation: Abnormal  ____________________________________________  RADIOLOGY  No acute disease ____________________________________________   PROCEDURES  Procedure(s) performed: no  Procedures  Critical Care performed: no  ____________________________________________   INITIAL IMPRESSION / ASSESSMENT AND PLAN / ED COURSE  Pertinent labs & imaging results that were available during my care of the patient were reviewed by me and considered in my medical decision making (see chart for details).  On arrival the patient is quite wheezy and short of breath. As her symptoms have been constant for 2 months and not resolving with steroids and albuterol and somewhat concerned that this could represent pregnancy-induced cardiomyopathy.  Fortunately a bedside ultrasound of her heart is normal and her BNP is unremarkable and no venous congestion on chest x-ray as this likely represents a pulmonary process.    ----------------------------------------- 6:25 PM on 10/22/2016 -----------------------------------------  Fortunately the patient has no signs of heart failure. She is still persistently tachycardic and wheezy and short of breath after multiple nebulizations. At this point she lives alone and is uncomfortable going home given her significant shortness of breath which I agree with. She will require inpatient admission. ____________________________________________  I discussed the case with the patient's OB gynecologist Dr. Verna Czech who has graciously agreed to admit the patient to his service with the hospitalist consulting.  FINAL CLINICAL IMPRESSION(S) / ED DIAGNOSES  Final diagnoses:  Moderate persistent asthma with exacerbation      NEW MEDICATIONS STARTED DURING THIS VISIT:  New Prescriptions   No medications on file     Note:  This document was prepared using Dragon voice recognition software and may include unintentional dictation errors.     Merrily Brittle, MD 10/22/16 [phone]

## 2016-10-22 NOTE — ED Triage Notes (Signed)
Patient seen by Shirlyn Goltz , Schermerhorn MD.   Patient has been ShOB for 2 months. Patient was hospitalized 1 month ago here for Asthma exacerbation.   Patient also states she is having Bilateral lower back pain, contributing to her Shob  28 weeks preg.

## 2016-10-22 NOTE — ED Notes (Signed)
EDP at bedside  

## 2016-10-22 NOTE — ED Notes (Signed)
Spoke with McShane regarding potential triage orders for this patient. Md stated "it will be clinician specific". No new orders received.

## 2016-10-22 NOTE — Consult Note (Signed)
Consult Note  [facility] - [place] @ [facility] [employer], D.O.   Patient Name: Brandi Fleming MR#: [id number] Date of Birth: April 30, 1986 Date of Admission: 10/22/2016  Referring MD/NP/PA: Dr. Feliberto Gottron Primary Care Physician: No PCP Per Patient Patient coming from: Home  Chief Complaint:  Chief Complaint  Patient presents with  . Shortness of Breath    HPI: Brandi Fleming is a 31 y.o. female with a known history of asthma, bipolar, G2P1 at [redacted] weeks EGA presents to the emergency department for evaluation of SOB.  Patient was in a usual state of health until 2 months ago when she describes the onset of an asthma attack.  She was initially prescribed zpack and then another antibiotic and prednisone which helped her symptoms temporarily however her cough has  Persisted.  She was admitted for status asthmaticus and again placed on steroids, however symptoms recurred one day ago.  She now complains of shortness of breath, cough and chest pain with coughing.  .  Patient denies fevers/chills, weakness, dizziness, N/V/C/D, abdominal pain, dysuria/frequency, changes in mental status.   She did not get her flu shot.    Asthma history diagnosed two years ago, has been to ED but never intubated.  Quit smoking 4 months ago.    EMS/ED Course: Patient received Duoneb x 4, magnesium sulfate 2g, Solumedrol , KCl .  Review of Systems:  CONSTITUTIONAL: No fever/chills, fatigue, weakness, weight gain/loss, headache. EYES: No blurry or double vision. ENT: No tinnitus, postnasal drip, redness or soreness of the oropharynx. RESPIRATORY: Positive cough, dyspnea, wheeze.  No hemoptysis.  CARDIOVASCULAR: No chest pain, palpitations, syncope, orthopnea. No lower extremity edema.  GASTROINTESTINAL: No nausea, vomiting, abdominal pain, diarrhea, constipation.  No hematemesis, melena or hematochezia. GENITOURINARY: No dysuria, frequency, hematuria. ENDOCRINE: No polyuria or nocturia.  No heat or cold intolerance. HEMATOLOGY: No anemia, bruising, bleeding. INTEGUMENTARY: No rashes, ulcers, lesions. MUSCULOSKELETAL: No arthritis, gout, dyspnea. NEUROLOGIC: No numbness, tingling, ataxia, seizure-type activity, weakness. PSYCHIATRIC: No anxiety, depression, insomnia.   Past Medical History:  Diagnosis Date  . Asthma   . Bipolar 1 disorder (HCC)     History reviewed. No pertinent surgical history.   reports that she has quit smoking. She has never used smokeless tobacco. She reports that she has current or past drug history. She reports that she does not drink alcohol.  Allergies  Allergen Reactions  . Vicodin [Hydrocodone-Acetaminophen] Itching    Family history: Father living with COPD, smoking Mother living  Prior to Admission medications   Medication Sig Start Date End Date Taking? Authorizing Provider  albuterol (PROVENTIL HFA;VENTOLIN HFA) 108 (90 Base) MCG/ACT inhaler Inhale 2 puffs into the lungs every 6 (six) hours as needed for wheezing or shortness of breath. 09/26/16  Yes Willy Eddy, MD  guaiFENesin (ROBITUSSIN) 100 MG/5ML SOLN Take 10 mLs (200 mg total) by mouth 4 (four) times daily. 09/28/16  Yes Ihor Austin Schermerhorn, MD  ipratropium-albuterol (DUONEB) 0.5-2.5 (3) MG/3ML SOLN Take 3 mLs by nebulization 4 (four) times daily. 09/28/16  Yes Ihor Austin Schermerhorn, MD  predniSONE (STERAPRED UNI-PAK 21 TAB) 10 MG (21) TBPK tablet Daily po dose 40 mg , 30 mg , 20 mg , 10 mg for 4 days 09/28/16  Yes Suzy Bouchard, MD  Prenatal Vit-Fe Fumarate-FA (PRENATAL PO) Take 1 tablet by mouth daily.   Yes Historical Provider, MD  albuterol (PROVENTIL HFA;VENTOLIN HFA) 108 (90 Base) MCG/ACT inhaler Inhale 2 puffs into the lungs every 6 (six) hours as needed for wheezing or  shortness of breath. 11/28/15   Tommi Rumps, PA-C  benzonatate (TESSALON PERLES) 100 MG capsule Take 2 capsules (200 mg total) by mouth 3 (three) times daily as needed for cough. Patient  not taking: Reported on 09/26/2016 11/28/15 11/27/16  Tommi Rumps, PA-C  benzonatate (TESSALON PERLES) 100 MG capsule Take 1 capsule (100 mg total) by mouth 3 (three) times daily as needed for cough. 09/28/16   Ihor Austin Schermerhorn, MD  predniSONE (DELTASONE) 10 MG tablet Take 6 tablets  today, on day 2 take 5 tablets, day 3 take 4 tablets, day 4 take 3 tablets, day 5 take  2 tablets and 1 tablet the last day Patient not taking: Reported on 09/26/2016 11/28/15   Tommi Rumps, PA-C  predniSONE (DELTASONE) 20 MG tablet Take 2 tablets (40 mg total) by mouth daily. 09/26/16 10/01/16  Willy Eddy, MD    Physical Exam: Vitals:   10/22/16 1700 10/22/16 1800 10/22/16 1830 10/22/16 1900  BP: 125/68 120/62 (!) 111/59 (!) 99/45  Pulse: (!) 110 (!) 124 (!) 118 (!) 123  Resp: 11 (!) 29 19 (!) 21  Temp:      TempSrc:      SpO2: 96% 93% 98% 97%  Weight:      Height:        GENERAL: 31 y.o.-year-old female patient, well-developed, well-nourished lying in the bed in no acute distress.  Pleasant and cooperative.   HEENT: Head atraumatic, normocephalic. Pupils equal, round, reactive. No scleral icterus. Extraocular muscles intact. Mucus membranes moist. NECK: Supple, full range of motion. No JVD, no bruit heard.  CHEST: Good air movement, Diffuse expiratory wheezes.   No use of accessory muscles of respiration.  No reproducible chest wall tenderness.  CARDIOVASCULAR: S1, S2 normal. No murmurs, rubs, or gallops. Cap refill <2 seconds. Pulses intact distally.  ABDOMEN: Gravid uterus.  EXTREMITIES: No pedal edema, cyanosis, or clubbing. No calf tenderness or Homan's sign.  NEUROLOGIC: The patient is alert and oriented x 3. Cranial nerves II through XII are grossly intact with no focal sensorimotor deficit. Muscle strength 5/5 in all extremities. Sensation intact. Gait not checked. PSYCHIATRIC:  Normal affect, mood, thought content. SKIN: Warm, dry, and intact without obvious rash, lesion, or ulcer.     Labs on Admission:  CBC:  Recent Labs Lab 10/22/16 1705  WBC 10.5  NEUTROABS 9.6*  HGB 11.9*  HCT 33.8*  MCV 89.2  PLT 176   Basic Metabolic Panel:  Recent Labs Lab 10/22/16 1705  NA 132*  K 2.9*  CL 102  CO2 23  GLUCOSE 89  BUN <5*  CREATININE <0.30*  CALCIUM 8.0*   GFR: CrCl cannot be calculated (This lab value cannot be used to calculate CrCl because it is not a number: <0.30). Liver Function Tests:  Recent Labs Lab 10/22/16 1705  AST 19  ALT 14  ALKPHOS 60  BILITOT 1.3*  PROT 6.0*  ALBUMIN 3.0*   No results for input(s): LIPASE, AMYLASE in the last 168 hours. No results for input(s): AMMONIA in the last 168 hours. Coagulation Profile: No results for input(s): INR, PROTIME in the last 168 hours. Cardiac Enzymes:  Recent Labs Lab 10/22/16 1705  TROPONINI <0.03   BNP (last 3 results) No results for input(s): PROBNP in the last 8760 hours. HbA1C: No results for input(s): HGBA1C in the last 72 hours. CBG: No results for input(s): GLUCAP in the last 168 hours. Lipid Profile: No results for input(s): CHOL, HDL, LDLCALC, TRIG, CHOLHDL, LDLDIRECT in  the last 72 hours. Thyroid Function Tests: No results for input(s): TSH, T4TOTAL, FREET4, T3FREE, THYROIDAB in the last 72 hours. Anemia Panel: No results for input(s): VITAMINB12, FOLATE, FERRITIN, TIBC, IRON, RETICCTPCT in the last 72 hours. Urine analysis:    Component Value Date/Time   COLORURINE YELLOW (A) 07/29/2016 1650   APPEARANCEUR CLEAR (A) 07/29/2016 1650   APPEARANCEUR Clear 03/22/2014 0928   LABSPEC 1.019 07/29/2016 1650   LABSPEC 1.002 03/22/2014 0928   PHURINE 5.0 07/29/2016 1650   GLUCOSEU NEGATIVE 07/29/2016 1650   GLUCOSEU Negative 03/22/2014 0928   HGBUR NEGATIVE 07/29/2016 1650   BILIRUBINUR NEGATIVE 07/29/2016 1650   BILIRUBINUR Negative 03/22/2014 0928   KETONESUR NEGATIVE 07/29/2016 1650   PROTEINUR NEGATIVE 07/29/2016 1650   UROBILINOGEN 0.2 06/11/2009 0654   NITRITE  NEGATIVE 07/29/2016 1650   LEUKOCYTESUR NEGATIVE 07/29/2016 1650   LEUKOCYTESUR Negative 03/22/2014 0928   Sepsis Labs: (procalcitonin:4,lacticidven:4) )No results found for this or any previous visit (from the past 240 hour(s)).   Radiological Exams on Admission: Dg Chest 1 View  Result Date: 10/22/2016 CLINICAL DATA:  Shortness of breath.  History of asthma EXAM: CHEST 1 VIEW COMPARISON:  September 26, 2016 FINDINGS: Lungs are clear. Heart is upper normal in size with pulmonary vascularity within normal limits. No adenopathy. No bone lesions. No pneumothorax. IMPRESSION: No edema or consolidation. Electronically Signed   By: Bretta Bang III M.D.   On: 10/22/2016 17:26    EKG: Sinus tachycardia at 114 bpm with normal axis and nonspecific ST-T wave changes.   Assessment/Plan  This is a 31 y.o. female with a history of asthma, bipolar, G2P1 [redacted] weeks EGA now being admitted with:  #. Asthma exacerbation in a pregnant patient -Continuous pulse oximetry, peak flow -Continue nebulizers, O2 and tapering steroids -Expectorant as needed - Check fluswab, sputum culture -Pulmonology consult given prolonged symptoms and pregnant state  #. Hyponatremia, mild - Repeat BMP in AM - Consider IVFs if not improving  #. Hypokalemia, mild - Replaced by mouth in ED  #. G2P1 at [redacted] weeks EGA - Continue prenatal vitamins - Expectant management per OB/GYN.   Admission status: Admitted by Ob/Gyn IV Fluids: HL Diet/Nutrition: Regular  DVT Px: SCDs and early ambulation. Code Status: Full Code  Disposition Plan: To home in 1-2 days  All the records are reviewed and case discussed with ED provider. Management plans discussed with the patient and/or family who express understanding and agree with plan of care.  Mystery Schrupp D.O. on 10/22/2016 at 8:23 PM Between 7am to 6pm - Pager - [phone] After 6pm go to [email or web address] - Biomedical engineer [place]  Hospitalists Office [phone] CC: Primary care physician; No PCP Per Patient   10/22/2016, 8:23 PM

## 2016-10-23 ENCOUNTER — Encounter: Payer: Self-pay | Admitting: *Deleted

## 2016-10-23 LAB — CBC
HCT: 33.2 % — ABNORMAL LOW (ref 35.0–47.0)
HEMOGLOBIN: 12 g/dL (ref 12.0–16.0)
MCH: 31.9 pg (ref 26.0–34.0)
MCHC: 36.1 g/dL — AB (ref 32.0–36.0)
MCV: 88.5 fL (ref 80.0–100.0)
PLATELETS: 200 10*3/uL (ref 150–440)
RBC: 3.76 MIL/uL — ABNORMAL LOW (ref 3.80–5.20)
RDW: 13.3 % (ref 11.5–14.5)
WBC: 11 10*3/uL (ref 3.6–11.0)

## 2016-10-23 LAB — TYPE AND SCREEN
ABO/RH(D): O NEG
Antibody Screen: NEGATIVE

## 2016-10-23 LAB — URINALYSIS, ROUTINE W REFLEX MICROSCOPIC
BILIRUBIN URINE: NEGATIVE
GLUCOSE, UA: 150 mg/dL — AB
Hgb urine dipstick: NEGATIVE
KETONES UR: 20 mg/dL — AB
LEUKOCYTES UA: NEGATIVE
Nitrite: NEGATIVE
PROTEIN: NEGATIVE mg/dL
Specific Gravity, Urine: 1.012 (ref 1.005–1.030)
pH: 7 (ref 5.0–8.0)

## 2016-10-23 LAB — COMPREHENSIVE METABOLIC PANEL
ALBUMIN: 3.1 g/dL — AB (ref 3.5–5.0)
ALT: 18 U/L (ref 14–54)
ANION GAP: 8 (ref 5–15)
AST: 36 U/L (ref 15–41)
Alkaline Phosphatase: 63 U/L (ref 38–126)
BILIRUBIN TOTAL: 1.2 mg/dL (ref 0.3–1.2)
BUN: <5 mg/dL — ABNORMAL LOW (ref 6–20)
CHLORIDE: 105 mmol/L (ref 101–111)
CO2: 21 mmol/L — AB (ref 22–32)
Calcium: 8.2 mg/dL — ABNORMAL LOW (ref 8.9–10.3)
Creatinine, Ser: 0.35 mg/dL — ABNORMAL LOW (ref 0.44–1.00)
GFR calc Af Amer: >60 mL/min (ref >60)
GFR calc non Af Amer: >60 mL/min (ref >60)
GLUCOSE: 160 mg/dL — AB (ref 65–99)
POTASSIUM: 3.2 mmol/L — AB (ref 3.5–5.1)
SODIUM: 134 mmol/L — AB (ref 135–145)
TOTAL PROTEIN: 6.4 g/dL — AB (ref 6.5–8.1)

## 2016-10-23 LAB — INFLUENZA PANEL BY PCR (TYPE A & B)
INFLAPCR: NEGATIVE
INFLBPCR: NEGATIVE

## 2016-10-23 LAB — MAGNESIUM: Magnesium: 1.8 mg/dL (ref 1.7–2.4)

## 2016-10-23 MED ORDER — IPRATROPIUM-ALBUTEROL 0.5-2.5 (3) MG/3ML IN SOLN
3.0000 mL | Freq: Four times a day (QID) | RESPIRATORY_TRACT | Status: DC
  Administered 2016-10-23 – 2016-10-24 (×6): 3 mL via RESPIRATORY_TRACT
  Filled 2016-10-23 (×9): qty 3

## 2016-10-23 MED ORDER — IPRATROPIUM-ALBUTEROL 0.5-2.5 (3) MG/3ML IN SOLN
3.0000 mL | Freq: Four times a day (QID) | RESPIRATORY_TRACT | Status: DC | PRN
  Administered 2016-10-23: 3 mL via RESPIRATORY_TRACT
  Filled 2016-10-23: qty 3

## 2016-10-23 MED ORDER — METHYLPREDNISOLONE SODIUM SUCC 125 MG IJ SOLR
60.0000 mg | Freq: Four times a day (QID) | INTRAMUSCULAR | Status: DC
  Administered 2016-10-23 (×2): 60 mg via INTRAVENOUS
  Filled 2016-10-23 (×2): qty 2

## 2016-10-23 MED ORDER — METHYLPREDNISOLONE SODIUM SUCC 125 MG IJ SOLR
60.0000 mg | Freq: Two times a day (BID) | INTRAMUSCULAR | Status: DC
  Administered 2016-10-23 – 2016-10-24 (×2): 60 mg via INTRAVENOUS
  Filled 2016-10-23: qty 0.96
  Filled 2016-10-23 (×2): qty 2

## 2016-10-23 MED ORDER — MAGNESIUM SULFATE 2 GM/50ML IV SOLN
2.0000 g | Freq: Once | INTRAVENOUS | Status: AC
  Administered 2016-10-23: 2 g via INTRAVENOUS
  Filled 2016-10-23: qty 50

## 2016-10-23 MED ORDER — BUDESONIDE 0.5 MG/2ML IN SUSP
0.5000 mg | Freq: Two times a day (BID) | RESPIRATORY_TRACT | Status: DC
  Administered 2016-10-23 – 2016-10-24 (×2): 0.5 mg via RESPIRATORY_TRACT
  Filled 2016-10-23 (×3): qty 2

## 2016-10-23 NOTE — Progress Notes (Signed)
Pt not able to do PEFR. Pt coughs uncontrollably when she takes a deep breath to blow. Nurse notified.

## 2016-10-23 NOTE — Progress Notes (Signed)
Patient ID: Brandi Fleming, female   DOB: 07-Nov-1985, 31 y.o.   MRN: [id number]  [employer] PROGRESS NOTE  Brandi Fleming:[id number] DOB: Dec 11, 1985 DOA: 10/22/2016 PCP: No PCP Per Patient  HPI/Subjective: Patient seen earlier. Lots of complaints. Especially that she was down in the ER for 12 hours without getting any food. She thinks that she is breathing about the same today as yesterday. She states that this is been her second hospitalization for the same thing.  Objective: Vitals:   10/23/16 0826 10/23/16 1210  BP: 118/60 128/76  Pulse: (!) 102 91  Resp: 20 18  Temp: 98.1 F (36.7 C) 97.8 F (36.6 C)    Filed Weights   10/22/16 1440  Weight: 88.5 kg (195 lb)    ROS: Review of Systems  Constitutional: Negative for chills and fever.  Eyes: Negative for blurred vision.  Respiratory: Positive for cough, shortness of breath and wheezing.   Cardiovascular: Negative for chest pain.  Gastrointestinal: Negative for abdominal pain, constipation, diarrhea, nausea and vomiting.  Genitourinary: Negative for dysuria.  Musculoskeletal: Positive for back pain. Negative for joint pain.  Neurological: Negative for dizziness and headaches.   Exam: Physical Exam  Constitutional: She is oriented to person, place, and time.  HENT:  Nose: No mucosal edema.  Mouth/Throat: No oropharyngeal exudate or posterior oropharyngeal edema.  Eyes: Conjunctivae, EOM and lids are normal. Pupils are equal, round, and reactive to light.  Neck: No JVD present. Carotid bruit is not present. No edema present. No thyroid mass and no thyromegaly present.  Cardiovascular: S1 normal and S2 normal.  Exam reveals no gallop.   No murmur heard. Pulses:      Dorsalis pedis pulses are 2+ on the right side, and 2+ on the left side.  Respiratory: No respiratory distress. She has decreased breath sounds in the right middle field, the right lower field, the left middle field and the left lower field. She has  wheezes in the right upper field, the right middle field, the right lower field, the left upper field, the left middle field and the left lower field. She has no rhonchi. She has no rales.  GI: Soft. Bowel sounds are normal. There is no tenderness.  Musculoskeletal:       Right ankle: She exhibits no swelling.       Left ankle: She exhibits no swelling.  Lymphadenopathy:    She has no cervical adenopathy.  Neurological: She is alert and oriented to person, place, and time. No cranial nerve deficit.  Skin: Skin is warm. No rash noted. Nails show no clubbing.  Psychiatric: She has a normal mood and affect.      Data Reviewed: Basic Metabolic Panel:  Recent Labs Lab 10/22/16 1705 10/23/16 0055  NA 132* 134*  K 2.9* 3.2*  CL 102 105  CO2 23 21*  GLUCOSE 89 160*  BUN <5* <5*  CREATININE <0.30* 0.35*  CALCIUM 8.0* 8.2*  MG  --  1.8   Liver Function Tests:  Recent Labs Lab 10/22/16 1705 10/23/16 0055  AST 19 36  ALT 14 18  ALKPHOS 60 63  BILITOT 1.3* 1.2  PROT 6.0* 6.4*  ALBUMIN 3.0* 3.1*   CBC:  Recent Labs Lab 10/22/16 1705 10/23/16 0055  WBC 10.5 11.0  NEUTROABS 9.6*  --   HGB 11.9* 12.0  HCT 33.8* 33.2*  MCV 89.2 88.5  PLT 176 200   Cardiac Enzymes:  Recent Labs Lab 10/22/16 1705  TROPONINI <0.03  BNP (last 3 results)  Recent Labs  10/22/16 1705  BNP 64.0      Studies: Dg Chest 1 View  Result Date: 10/22/2016 CLINICAL DATA:  Shortness of breath.  History of asthma EXAM: CHEST 1 VIEW COMPARISON:  September 26, 2016 FINDINGS: Lungs are clear. Heart is upper normal in size with pulmonary vascularity within normal limits. No adenopathy. No bone lesions. No pneumothorax. IMPRESSION: No edema or consolidation. Electronically Signed   By: Bretta Bang III M.D.   On: 10/22/2016 17:26    Scheduled Meds: . docusate sodium  100 mg Oral Daily  . ipratropium-albuterol  3 mL Nebulization Q6H  . methylPREDNISolone (SOLU-MEDROL) injection  60 mg  Intravenous Q12H  . potassium chloride  20 mEq Oral TID  . prenatal multivitamin  1 tablet Oral Q1200   :  Assessment/Plan:  1. Asthma exacerbation in a pregnant patient. Continue Solu-Medrol. Standing dose nebulizer treatments. Add budesonide nebulizer. Patient stated that she was sent home without a nebulizer machine. 2. Hypokalemia and hypomagnesemia. Replace potassium orally and magnesium IV 1 3. Hyponatremia. Continue to monitor 4. Impaired fasting glucose likely secondary to steroids  Code Status:     Code Status Orders        Start     Ordered   10/22/16 2302  Full code  Continuous     10/22/16 2302    Code Status History    Date Active Date Inactive Code Status Order ID Comments User Context   10/22/2016  8:16 PM 10/22/2016 11:02 PM Full Code [id number]  Suzy Bouchard, MD ED   09/26/2016 12:06 PM 09/28/2016  4:03 PM Full Code [id number]  Sharee Pimple, CNM ED     Disposition Plan: Need to break down inflammation the lungs prior to disposition  Consultants:  Gynecology  Time spent: 28 minutes  Alford Highland  [employer]

## 2016-10-24 LAB — COMPREHENSIVE METABOLIC PANEL
ALT: 18 U/L (ref 14–54)
AST: 26 U/L (ref 15–41)
Albumin: 2.9 g/dL — ABNORMAL LOW (ref 3.5–5.0)
Alkaline Phosphatase: 57 U/L (ref 38–126)
Anion gap: 7 (ref 5–15)
BUN: 6 mg/dL (ref 6–20)
CHLORIDE: 107 mmol/L (ref 101–111)
CO2: 21 mmol/L — AB (ref 22–32)
CREATININE: 0.4 mg/dL — AB (ref 0.44–1.00)
Calcium: 8.4 mg/dL — ABNORMAL LOW (ref 8.9–10.3)
Glucose, Bld: 111 mg/dL — ABNORMAL HIGH (ref 65–99)
POTASSIUM: 3.7 mmol/L (ref 3.5–5.1)
SODIUM: 135 mmol/L (ref 135–145)
Total Bilirubin: 0.4 mg/dL (ref 0.3–1.2)
Total Protein: 6.1 g/dL — ABNORMAL LOW (ref 6.5–8.1)

## 2016-10-24 MED ORDER — ONDANSETRON HCL 8 MG PO TABS: 8.0000 mg | ORAL_TABLET | Freq: Three times a day (TID) | ORAL | 2 refills | Status: DC | PRN

## 2016-10-24 MED ORDER — PREDNISONE 5 MG PO TABS: ORAL_TABLET | ORAL | 0 refills | Status: DC

## 2016-10-24 MED ORDER — BECLOMETHASONE DIPROPIONATE 40 MCG/ACT IN AERS: 1.0000 | INHALATION_SPRAY | Freq: Two times a day (BID) | RESPIRATORY_TRACT | 0 refills | Status: DC

## 2016-10-24 MED ORDER — ALBUTEROL SULFATE (2.5 MG/3ML) 0.083% IN NEBU: 2.5000 mg | INHALATION_SOLUTION | Freq: Four times a day (QID) | RESPIRATORY_TRACT | 0 refills | Status: DC | PRN

## 2016-10-24 MED ORDER — POTASSIUM CHLORIDE ER 10 MEQ PO TBCR: 10.0000 meq | EXTENDED_RELEASE_TABLET | Freq: Two times a day (BID) | ORAL | 1 refills | Status: DC

## 2016-10-24 NOTE — Progress Notes (Addendum)
Patient ID: Brandi Fleming, female   DOB: 20-Aug-1985, 31 y.o.   MRN: [id number]  Brandi Fleming is a 31 y.o. female. She is at [redacted]w[redacted]d gestation. Patient's last menstrual period was 01/15/2016. Admitted 2 days ago for worsening RAD and SOB . Hospitalist service was consulted and they are managingh her pulmonary issue . Feels better today . NST yesterday and I scheduled for one today   Estimated Date of Delivery: 01/16/17  Prenatal care site: [facility]   Prenatal care site: [facility]   Chief Complaint: high risk pregnancy, need for antepartum surveillance  S: Resting comfortably. no CTX, no VB.no LOF,  Active fetal movement.      Maternal Medical History:   Past Medical History:  Diagnosis Date  . Asthma   . Bipolar 1 disorder (HCC)     History reviewed. No pertinent surgical history.  Allergies  Allergen Reactions  . Vicodin [Hydrocodone-Acetaminophen] Itching    Prior to Admission medications   Medication Sig Start Date End Date Taking? Authorizing Provider  albuterol (PROVENTIL HFA;VENTOLIN HFA) 108 (90 Base) MCG/ACT inhaler Inhale 2 puffs into the lungs every 6 (six) hours as needed for wheezing or shortness of breath. 09/26/16  Yes Willy Eddy, MD  guaiFENesin (ROBITUSSIN) 100 MG/5ML SOLN Take 10 mLs (200 mg total) by mouth 4 (four) times daily. 09/28/16  Yes Ihor Austin Schermerhorn, MD  ipratropium-albuterol (DUONEB) 0.5-2.5 (3) MG/3ML SOLN Take 3 mLs by nebulization 4 (four) times daily. 09/28/16  Yes Ihor Austin Schermerhorn, MD  predniSONE (STERAPRED UNI-PAK 21 TAB) 10 MG (21) TBPK tablet Daily po dose 40 mg , 30 mg , 20 mg , 10 mg for 4 days 09/28/16  Yes Suzy Bouchard, MD  Prenatal Vit-Fe Fumarate-FA (PRENATAL PO) Take 1 tablet by mouth daily.   Yes Historical Provider, MD  albuterol (PROVENTIL HFA;VENTOLIN HFA) 108 (90 Base) MCG/ACT inhaler Inhale 2 puffs into the lungs every 6 (six) hours as needed for wheezing or shortness of breath.  11/28/15   Tommi Rumps, PA-C  benzonatate (TESSALON PERLES) 100 MG capsule Take 2 capsules (200 mg total) by mouth 3 (three) times daily as needed for cough. Patient not taking: Reported on 09/26/2016 11/28/15 11/27/16  Tommi Rumps, PA-C  benzonatate (TESSALON PERLES) 100 MG capsule Take 1 capsule (100 mg total) by mouth 3 (three) times daily as needed for cough. 09/28/16   Ihor Austin Schermerhorn, MD  predniSONE (DELTASONE) 10 MG tablet Take 6 tablets  today, on day 2 take 5 tablets, day 3 take 4 tablets, day 4 take 3 tablets, day 5 take  2 tablets and 1 tablet the last day Patient not taking: Reported on 09/26/2016 11/28/15   Tommi Rumps, PA-C  predniSONE (DELTASONE) 20 MG tablet Take 2 tablets (40 mg total) by mouth daily. 09/26/16 10/01/16  Willy Eddy, MD     Social History: She  reports that she has quit smoking. She has never used smokeless tobacco. She reports that she has current or past drug history. She reports that she does not drink alcohol.  Family History: family history is not on file. Review of Systems: A full review of systems was performed and negative except as noted in the HPI.   pulm: + RAD  O:  BP 130/75 (BP Location: Right Arm)   Pulse 79   Temp 97.5 F (36.4 C) (Oral)   Resp 18   Ht  (1.575 m)   Wt 88.5 kg (195 lb)  LMP 01/15/2016   SpO2 96%   BMI 35.67 kg/m  Results for orders placed or performed during the hospital encounter of 10/22/16 (from the past 48 hour(s))  Basic metabolic panel   Collection Time: 10/22/16  5:05 PM  Result Value Ref Range   Sodium 132 (L) 135 - 145 mmol/L   Potassium 2.9 (L) 3.5 - 5.1 mmol/L   Chloride 102 101 - 111 mmol/L   CO2 23 22 - 32 mmol/L   Glucose, Bld 89 65 - 99 mg/dL   BUN <5 (L) 6 - 20 mg/dL   Creatinine, Ser <1.61 (L) 0.44 - 1.00 mg/dL   Calcium 8.0 (L) 8.9 - 10.3 mg/dL   GFR calc non Af Amer NOT CALCULATED >60 mL/min   GFR calc Af Amer NOT CALCULATED >60 mL/min   Anion gap 7 5 - 15  Hepatic  function panel   Collection Time: 10/22/16  5:05 PM  Result Value Ref Range   Total Protein 6.0 (L) 6.5 - 8.1 g/dL   Albumin 3.0 (L) 3.5 - 5.0 g/dL   AST 19 15 - 41 U/L   ALT 14 14 - 54 U/L   Alkaline Phosphatase 60 38 - 126 U/L   Total Bilirubin 1.3 (H) 0.3 - 1.2 mg/dL   Bilirubin, Direct 0.2 0.1 - 0.5 mg/dL   Indirect Bilirubin 1.1 (H) 0.3 - 0.9 mg/dL  Brain natriuretic peptide   Collection Time: 10/22/16  5:05 PM  Result Value Ref Range   B Natriuretic Peptide 64.0 0.0 - 100.0 pg/mL  Troponin I   Collection Time: 10/22/16  5:05 PM  Result Value Ref Range   Troponin I <0.03 <0.03 ng/mL  CBC with Differential   Collection Time: 10/22/16  5:05 PM  Result Value Ref Range   WBC 10.5 3.6 - 11.0 K/uL   RBC 3.79 (L) 3.80 - 5.20 MIL/uL   Hemoglobin 11.9 (L) 12.0 - 16.0 g/dL   HCT 09.6 (L) 04.5 - 40.9 %   MCV 89.2 80.0 - 100.0 fL   MCH 31.3 26.0 - 34.0 pg   MCHC 35.1 32.0 - 36.0 g/dL   RDW 81.1 91.4 - 78.2 %   Platelets 176 150 - 440 K/uL   Neutrophils Relative % 91 %   Neutro Abs 9.6 (H) 1.4 - 6.5 K/uL   Lymphocytes Relative 4 %   Lymphs Abs 0.4 (L) 1.0 - 3.6 K/uL   Monocytes Relative 5 %   Monocytes Absolute 0.5 0.2 - 0.9 K/uL   Eosinophils Relative 0 %   Eosinophils Absolute 0.0 0 - 0.7 K/uL   Basophils Relative 0 %   Basophils Absolute 0.0 0 - 0.1 K/uL  Blood gas, venous   Collection Time: 10/22/16  5:05 PM  Result Value Ref Range   FIO2 PENDING    pH, Ven 7.44 (H) 7.250 - 7.430   pCO2, Ven 35 (L) 44.0 - 60.0 mmHg   pO2, Ven 42.0 32.0 - 45.0 mmHg   Bicarbonate 23.8 20.0 - 28.0 mmol/L   Acid-base deficit 0.0 0.0 - 2.0 mmol/L   O2 Saturation 79.6 %   Patient temperature 37.0    Collection site PENDING    Sample type PENDING    Mechanical Rate PENDING   Comprehensive metabolic panel   Collection Time: 10/23/16 12:55 AM  Result Value Ref Range   Sodium 134 (L) 135 - 145 mmol/L   Potassium 3.2 (L) 3.5 - 5.1 mmol/L   Chloride 105 101 - 111 mmol/L   CO2 21 (  L) 22 -  32 mmol/L   Glucose, Bld 160 (H) 65 - 99 mg/dL   BUN <5 (L) 6 - 20 mg/dL   Creatinine, Ser 1.61 (L) 0.44 - 1.00 mg/dL   Calcium 8.2 (L) 8.9 - 10.3 mg/dL   Total Protein 6.4 (L) 6.5 - 8.1 g/dL   Albumin 3.1 (L) 3.5 - 5.0 g/dL   AST 36 15 - 41 U/L   ALT 18 14 - 54 U/L   Alkaline Phosphatase 63 38 - 126 U/L   Total Bilirubin 1.2 0.3 - 1.2 mg/dL   GFR calc non Af Amer >60 >60 mL/min   GFR calc Af Amer >60 >60 mL/min   Anion gap 8 5 - 15  CBC   Collection Time: 10/23/16 12:55 AM  Result Value Ref Range   WBC 11.0 3.6 - 11.0 K/uL   RBC 3.76 (L) 3.80 - 5.20 MIL/uL   Hemoglobin 12.0 12.0 - 16.0 g/dL   HCT 09.6 (L) 04.5 - 40.9 %   MCV 88.5 80.0 - 100.0 fL   MCH 31.9 26.0 - 34.0 pg   MCHC 36.1 (H) 32.0 - 36.0 g/dL   RDW 81.1 91.4 - 78.2 %   Platelets 200 150 - 440 K/uL  Magnesium   Collection Time: 10/23/16 12:55 AM  Result Value Ref Range   Magnesium 1.8 1.7 - 2.4 mg/dL  Type and screen [facility]   Collection Time: 10/23/16 12:55 AM  Result Value Ref Range   ABO/RH(D) O NEG    Antibody Screen NEG    Sample Expiration 10/26/2016   Influenza panel by PCR (type A & B)   Collection Time: 10/23/16  2:18 AM  Result Value Ref Range   Influenza A By PCR NEGATIVE NEGATIVE   Influenza B By PCR NEGATIVE NEGATIVE  Urinalysis, Routine w reflex microscopic   Collection Time: 10/23/16  2:19 AM  Result Value Ref Range   Color, Urine YELLOW (A) YELLOW   APPearance HAZY (A) CLEAR   Specific Gravity, Urine 1.012 1.005 - 1.030   pH 7.0 5.0 - 8.0   Glucose, UA 150 (A) NEGATIVE mg/dL   Hgb urine dipstick NEGATIVE NEGATIVE   Bilirubin Urine NEGATIVE NEGATIVE   Ketones, ur 20 (A) NEGATIVE mg/dL   Protein, ur NEGATIVE NEGATIVE mg/dL   Nitrite NEGATIVE NEGATIVE   Leukocytes, UA NEGATIVE NEGATIVE     Constitutional: NAD, AAOx3  HE/ENT: extraocular movements grossly intact, moist mucous membranes CV: RRR PULM: wheezes throughout .     Abd: gravid, non-tender,  non-distended, soft , gravid      Ext: Non-tender, Nonedmeatous   Psych: mood appropriate, speech normal Pelvic deferred  NST: Reactive     A/P: 31 y.o. [redacted]w[redacted]d here for worsening RAD  With some improvement subjectively  Hypo natremia and Hypokalemia  Cont K+ replacement  Repeat CMP today   Cont treatment as per Hospitalist service ----- Ihor Austin Schermerhorn MD Attending Obstetrician and Gynecologist [facility], Department of OB/GYN [facility]

## 2016-10-24 NOTE — Progress Notes (Signed)
D/C home to car via auxiliary in wheelchair.  

## 2016-10-24 NOTE — Progress Notes (Signed)
ANTEPARTUM PROGRESS NOTE  Brandi Fleming is a 31 y.o. G2P1 [facility] Estimated Date of Delivery: 01/16/17 who is admitted for acute asthma exacerbation    Length of Stay:  2 Days. Admitted 10/22/2016  Subjective: Just finished breathing treatment   Patient reports good fetal movement.  She reports no uterine contractions, no bleeding and no loss of fluid per vagina.  Vitals:  BP (!) 139/100 (BP Location: Right Arm)   Pulse 84   Temp 98.5 F (36.9 C) (Oral)   Resp 20   Ht '5\' 2"'$  (1.575 m)   Wt 88.5 kg (195 lb)   LMP 01/15/2016   SpO2 96%   BMI 35.67 kg/m  Physical Examination: CONSTITUTIONAL: Well-developed, well-nourished female in no acute distress.  HENT:  Normocephalic, atraumatic, External right and left ear normal. Oropharynx is clear and moist EYES: Conjunctivae and EOM are normal. Pupils are equal, round, and reactive to light. No scleral icterus.  NECK: Normal range of motion, supple, no masses SKIN: Skin is warm and dry. No rash noted. Not diaphoretic. No erythema. No pallor. [place] Alert and oriented to person, place, and time. Normal reflexes, muscle tone coordination. No cranial nerve deficit noted. PSYCHIATRIC: Normal mood and affect. Normal behavior. Normal judgment and thought content. CARDIOVASCULAR: Normal heart rate noted, regular rhythm RESPIRATORY: Effort and breath sounds normal, no problems with respiration noted, lungs wheezing on ascultation bilaterally MUSCULOSKELETAL: Normal range of motion. No edema and no tenderness. 2+ distal pulses. ABDOMEN: Soft, nontender, nondistended, gravid. CERVIX:  decerred  Fetal monitoring: daily Uterine activity: none  Results for orders placed or performed during the hospital encounter of 10/22/16 (from the past 48 hour(s))  Basic metabolic panel     Status: Abnormal   Collection Time: 10/22/16  5:05 PM  Result Value Ref Range   Sodium 132 (L) 135 - 145 mmol/L   Potassium 2.9 (L) 3.5 - 5.1 mmol/L   Chloride 102 101 - 111 mmol/L   CO2 23 22 - 32 mmol/L   Glucose, Bld 89 65 - 99 mg/dL   BUN <5 (L) 6 - 20 mg/dL   Creatinine, Ser <0.30 (L) 0.44 - 1.00 mg/dL   Calcium 8.0 (L) 8.9 - 10.3 mg/dL   GFR calc non Af Amer NOT CALCULATED >60 mL/min   GFR calc Af Amer NOT CALCULATED >60 mL/min    Comment: (NOTE) The eGFR has been calculated using the CKD EPI equation. This calculation has not been validated in all clinical situations. eGFR's persistently <60 mL/min signify possible Chronic Kidney Disease.    Anion gap 7 5 - 15  Hepatic function panel     Status: Abnormal   Collection Time: 10/22/16  5:05 PM  Result Value Ref Range   Total Protein 6.0 (L) 6.5 - 8.1 g/dL   Albumin 3.0 (L) 3.5 - 5.0 g/dL   AST 19 15 - 41 U/L   ALT 14 14 - 54 U/L   Alkaline Phosphatase 60 38 - 126 U/L   Total Bilirubin 1.3 (H) 0.3 - 1.2 mg/dL   Bilirubin, Direct 0.2 0.1 - 0.5 mg/dL   Indirect Bilirubin 1.1 (H) 0.3 - 0.9 mg/dL  Brain natriuretic peptide     Status: None   Collection Time: 10/22/16  5:05 PM  Result Value Ref Range   B Natriuretic Peptide 64.0 0.0 - 100.0 pg/mL  Troponin I     Status: None   Collection Time: 10/22/16  5:05 PM  Result Value Ref Range  Troponin I <0.03 <0.03 ng/mL  CBC with Differential     Status: Abnormal   Collection Time: 10/22/16  5:05 PM  Result Value Ref Range   WBC 10.5 3.6 - 11.0 K/uL   RBC 3.79 (L) 3.80 - 5.20 MIL/uL   Hemoglobin 11.9 (L) 12.0 - 16.0 g/dL   HCT 33.8 (L) 35.0 - 47.0 %   MCV 89.2 80.0 - 100.0 fL   MCH 31.3 26.0 - 34.0 pg   MCHC 35.1 32.0 - 36.0 g/dL   RDW 13.0 11.5 - 14.5 %   Platelets 176 150 - 440 K/uL   Neutrophils Relative % 91 %   Neutro Abs 9.6 (H) 1.4 - 6.5 K/uL   Lymphocytes Relative 4 %   Lymphs Abs 0.4 (L) 1.0 - 3.6 K/uL   Monocytes Relative 5 %   Monocytes Absolute 0.5 0.2 - 0.9 K/uL   Eosinophils Relative 0 %   Eosinophils Absolute 0.0 0 - 0.7 K/uL   Basophils Relative 0 %   Basophils Absolute 0.0 0 - 0.1 K/uL  Blood gas,  venous     Status: Abnormal (Preliminary result)   Collection Time: 10/22/16  5:05 PM  Result Value Ref Range   FIO2 PENDING    pH, Ven 7.44 (H) 7.250 - 7.430   pCO2, Ven 35 (L) 44.0 - 60.0 mmHg   pO2, Ven 42.0 32.0 - 45.0 mmHg   Bicarbonate 23.8 20.0 - 28.0 mmol/L   Acid-base deficit 0.0 0.0 - 2.0 mmol/L   O2 Saturation 79.6 %   Patient temperature 37.0    Collection site PENDING    Sample type PENDING    Mechanical Rate PENDING   Type and screen [place]     Status: None   Collection Time: 10/23/16 12:55 AM  Result Value Ref Range   ABO/RH(D) O NEG    Antibody Screen NEG    Sample Expiration 10/26/2016   Comprehensive metabolic panel     Status: Abnormal   Collection Time: 10/23/16 12:55 AM  Result Value Ref Range   Sodium 134 (L) 135 - 145 mmol/L   Potassium 3.2 (L) 3.5 - 5.1 mmol/L   Chloride 105 101 - 111 mmol/L   CO2 21 (L) 22 - 32 mmol/L   Glucose, Bld 160 (H) 65 - 99 mg/dL   BUN <5 (L) 6 - 20 mg/dL   Creatinine, Ser 0.35 (L) 0.44 - 1.00 mg/dL   Calcium 8.2 (L) 8.9 - 10.3 mg/dL   Total Protein 6.4 (L) 6.5 - 8.1 g/dL   Albumin 3.1 (L) 3.5 - 5.0 g/dL   AST 36 15 - 41 U/L   ALT 18 14 - 54 U/L   Alkaline Phosphatase 63 38 - 126 U/L   Total Bilirubin 1.2 0.3 - 1.2 mg/dL   GFR calc non Af Amer >60 >60 mL/min   GFR calc Af Amer >60 >60 mL/min    Comment: (NOTE) The eGFR has been calculated using the CKD EPI equation. This calculation has not been validated in all clinical situations. eGFR's persistently <60 mL/min signify possible Chronic Kidney Disease.    Anion gap 8 5 - 15  CBC     Status: Abnormal   Collection Time: 10/23/16 12:55 AM  Result Value Ref Range   WBC 11.0 3.6 - 11.0 K/uL   RBC 3.76 (L) 3.80 - 5.20 MIL/uL   Hemoglobin 12.0 12.0 - 16.0 g/dL   HCT 33.2 (L) 35.0 - 47.0 %   MCV 88.5 80.0 -  100.0 fL   MCH 31.9 26.0 - 34.0 pg   MCHC 36.1 (H) 32.0 - 36.0 g/dL   RDW 13.3 11.5 - 14.5 %   Platelets 200 150 - 440 K/uL  Magnesium      Status: None   Collection Time: 10/23/16 12:55 AM  Result Value Ref Range   Magnesium 1.8 1.7 - 2.4 mg/dL  Influenza panel by PCR (type A & B)     Status: None   Collection Time: 10/23/16  2:18 AM  Result Value Ref Range   Influenza A By PCR NEGATIVE NEGATIVE   Influenza B By PCR NEGATIVE NEGATIVE    Comment: (NOTE) The Xpert Xpress Flu assay is intended as an aid in the diagnosis of  influenza and should not be used as a sole basis for treatment.  This  assay is FDA approved for nasopharyngeal swab specimens only. Nasal  washings and aspirates are unacceptable for Xpert Xpress Flu testing.   Urinalysis, Routine w reflex microscopic     Status: Abnormal   Collection Time: 10/23/16  2:19 AM  Result Value Ref Range   Color, Urine YELLOW (A) YELLOW   APPearance HAZY (A) CLEAR   Specific Gravity, Urine 1.012 1.005 - 1.030   pH 7.0 5.0 - 8.0   Glucose, UA 150 (A) NEGATIVE mg/dL   Hgb urine dipstick NEGATIVE NEGATIVE   Bilirubin Urine NEGATIVE NEGATIVE   Ketones, ur 20 (A) NEGATIVE mg/dL   Protein, ur NEGATIVE NEGATIVE mg/dL   Nitrite NEGATIVE NEGATIVE   Leukocytes, UA NEGATIVE NEGATIVE    Dg Chest 1 View  Result Date: 10/22/2016 CLINICAL DATA:  Shortness of breath.  History of asthma EXAM: CHEST 1 VIEW COMPARISON:  September 26, 2016 FINDINGS: Lungs are clear. Heart is upper normal in size with pulmonary vascularity within normal limits. No adenopathy. No bone lesions. No pneumothorax. IMPRESSION: No edema or consolidation. Electronically Signed   By: Lowella Grip III M.D.   On: 10/22/2016 17:26    Current scheduled medications . budesonide (PULMICORT) nebulizer solution  0.5 mg Nebulization BID  . docusate sodium  100 mg Oral Daily  . ipratropium-albuterol  3 mL Nebulization Q6H  . methylPREDNISolone (SOLU-MEDROL) injection  60 mg Intravenous Q12H  . potassium chloride  20 mEq Oral TID  . prenatal multivitamin  1 tablet Oral Q1200    I have reviewed the patient's current  medications.  ASSESSMENT: Patient Active Problem List   Diagnosis Date Noted  . Shortness of breath in antepartum period 10/22/2016  . Asthma affecting pregnancy, antepartum 09/26/2016  . Bipolar 1 disorder ([place])     PLAN: pulm:  Medicine on boards, helping manage, continue breathing treatments scheduled - no longer PRN   Continue routine antenatal care. NST daily while inpatient    ----- Larey Days, MD Attending Obstetrician and Gynecologist [facility]

## 2016-10-24 NOTE — Progress Notes (Signed)
Patient ID: Brandi Fleming, female   DOB: 06/06/1986, 31 y.o.   MRN: [id number] [employer] - [place] at [facility] [name] was admitted to the Hospital on 10/22/2016 and Discharged  10/24/2016 and should be excused from work/school   for 5 days starting 10/22/2016 , may return to work/school without any restrictions.  Alford Highland M.D on 10/24/2016,at 2:15 PM  [employer] - [place] at [facility]  [phone]

## 2016-10-24 NOTE — Progress Notes (Signed)
D/C instructions provided, pt states understanding, aware of follow up appt. Prescriptions given to pt.   

## 2016-10-24 NOTE — Discharge Summary (Addendum)
Physician Discharge Summary  Patient ID: Brandi Fleming MRN: [id number] DOB/AGE: [birth year] 31 y.o.  Admit date: 10/22/2016 Discharge date: 10/24/2016  Admission Diagnoses:exacerbation of Asthma and SOB   Discharge Diagnoses:  Active Problems:   Asthma affecting pregnancy, antepartum   Shortness of breath in antepartum period   Discharged Condition: good Hospital Course: airway issues handled by IM. Placed on IV steroids and inhalational steroids  And albuterol . Symptoms improved 48 hr . Reactive NST daily  Consults: internal medicine   Significant Diagnostic Studies: labs: hyponatremia and hypokalemia  Results for orders placed or performed during the hospital encounter of 10/22/16 (from the past 72 hour(s))  Basic metabolic panel     Status: Abnormal   Collection Time: 10/22/16  5:05 PM  Result Value Ref Range   Sodium 132 (L) 135 - 145 mmol/L   Potassium 2.9 (L) 3.5 - 5.1 mmol/L   Chloride 102 101 - 111 mmol/L   CO2 23 22 - 32 mmol/L   Glucose, Bld 89 65 - 99 mg/dL   BUN <5 (L) 6 - 20 mg/dL   Creatinine, Ser <0.30 (L) 0.44 - 1.00 mg/dL   Calcium 8.0 (L) 8.9 - 10.3 mg/dL   GFR calc non Af Amer NOT CALCULATED >60 mL/min   GFR calc Af Amer NOT CALCULATED >60 mL/min    Comment: (NOTE) The eGFR has been calculated using the CKD EPI equation. This calculation has not been validated in all clinical situations. eGFR's persistently <60 mL/min signify possible Chronic Kidney Disease.    Anion gap 7 5 - 15  Hepatic function panel     Status: Abnormal   Collection Time: 10/22/16  5:05 PM  Result Value Ref Range   Total Protein 6.0 (L) 6.5 - 8.1 g/dL   Albumin 3.0 (L) 3.5 - 5.0 g/dL   AST 19 15 - 41 U/L   ALT 14 14 - 54 U/L   Alkaline Phosphatase 60 38 - 126 U/L   Total Bilirubin 1.3 (H) 0.3 - 1.2 mg/dL   Bilirubin, Direct 0.2 0.1 - 0.5 mg/dL   Indirect Bilirubin 1.1 (H) 0.3 - 0.9 mg/dL  Brain natriuretic peptide     Status: None   Collection Time: 10/22/16  5:05 PM   Result Value Ref Range   B Natriuretic Peptide 64.0 0.0 - 100.0 pg/mL  Troponin I     Status: None   Collection Time: 10/22/16  5:05 PM  Result Value Ref Range   Troponin I <0.03 <0.03 ng/mL  CBC with Differential     Status: Abnormal   Collection Time: 10/22/16  5:05 PM  Result Value Ref Range   WBC 10.5 3.6 - 11.0 K/uL   RBC 3.79 (L) 3.80 - 5.20 MIL/uL   Hemoglobin 11.9 (L) 12.0 - 16.0 g/dL   HCT 33.8 (L) 35.0 - 47.0 %   MCV 89.2 80.0 - 100.0 fL   MCH 31.3 26.0 - 34.0 pg   MCHC 35.1 32.0 - 36.0 g/dL   RDW 13.0 11.5 - 14.5 %   Platelets 176 150 - 440 K/uL   Neutrophils Relative % 91 %   Neutro Abs 9.6 (H) 1.4 - 6.5 K/uL   Lymphocytes Relative 4 %   Lymphs Abs 0.4 (L) 1.0 - 3.6 K/uL   Monocytes Relative 5 %   Monocytes Absolute 0.5 0.2 - 0.9 K/uL   Eosinophils Relative 0 %   Eosinophils Absolute 0.0 0 - 0.7 K/uL   Basophils Relative 0 %   Basophils Absolute  0.0 0 - 0.1 K/uL  Blood gas, venous     Status: Abnormal (Preliminary result)   Collection Time: 10/22/16  5:05 PM  Result Value Ref Range   FIO2 PENDING    pH, Ven 7.44 (H) 7.250 - 7.430   pCO2, Ven 35 (L) 44.0 - 60.0 mmHg   pO2, Ven 42.0 32.0 - 45.0 mmHg   Bicarbonate 23.8 20.0 - 28.0 mmol/L   Acid-base deficit 0.0 0.0 - 2.0 mmol/L   O2 Saturation 79.6 %   Patient temperature 37.0    Collection site PENDING    Sample type PENDING    Mechanical Rate PENDING   Type and screen [place]     Status: None   Collection Time: 10/23/16 12:55 AM  Result Value Ref Range   ABO/RH(D) O NEG    Antibody Screen NEG    Sample Expiration 10/26/2016   Comprehensive metabolic panel     Status: Abnormal   Collection Time: 10/23/16 12:55 AM  Result Value Ref Range   Sodium 134 (L) 135 - 145 mmol/L   Potassium 3.2 (L) 3.5 - 5.1 mmol/L   Chloride 105 101 - 111 mmol/L   CO2 21 (L) 22 - 32 mmol/L   Glucose, Bld 160 (H) 65 - 99 mg/dL   BUN <5 (L) 6 - 20 mg/dL   Creatinine, Ser 0.35 (L) 0.44 - 1.00 mg/dL    Calcium 8.2 (L) 8.9 - 10.3 mg/dL   Total Protein 6.4 (L) 6.5 - 8.1 g/dL   Albumin 3.1 (L) 3.5 - 5.0 g/dL   AST 36 15 - 41 U/L   ALT 18 14 - 54 U/L   Alkaline Phosphatase 63 38 - 126 U/L   Total Bilirubin 1.2 0.3 - 1.2 mg/dL   GFR calc non Af Amer >60 >60 mL/min   GFR calc Af Amer >60 >60 mL/min    Comment: (NOTE) The eGFR has been calculated using the CKD EPI equation. This calculation has not been validated in all clinical situations. eGFR's persistently <60 mL/min signify possible Chronic Kidney Disease.    Anion gap 8 5 - 15  CBC     Status: Abnormal   Collection Time: 10/23/16 12:55 AM  Result Value Ref Range   WBC 11.0 3.6 - 11.0 K/uL   RBC 3.76 (L) 3.80 - 5.20 MIL/uL   Hemoglobin 12.0 12.0 - 16.0 g/dL   HCT 33.2 (L) 35.0 - 47.0 %   MCV 88.5 80.0 - 100.0 fL   MCH 31.9 26.0 - 34.0 pg   MCHC 36.1 (H) 32.0 - 36.0 g/dL   RDW 13.3 11.5 - 14.5 %   Platelets 200 150 - 440 K/uL  Magnesium     Status: None   Collection Time: 10/23/16 12:55 AM  Result Value Ref Range   Magnesium 1.8 1.7 - 2.4 mg/dL  Influenza panel by PCR (type A & B)     Status: None   Collection Time: 10/23/16  2:18 AM  Result Value Ref Range   Influenza A By PCR NEGATIVE NEGATIVE   Influenza B By PCR NEGATIVE NEGATIVE    Comment: (NOTE) The Xpert Xpress Flu assay is intended as an aid in the diagnosis of  influenza and should not be used as a sole basis for treatment.  This  assay is FDA approved for nasopharyngeal swab specimens only. Nasal  washings and aspirates are unacceptable for Xpert Xpress Flu testing.   Urinalysis, Routine w reflex microscopic     Status:  Abnormal   Collection Time: 10/23/16  2:19 AM  Result Value Ref Range   Color, Urine YELLOW (A) YELLOW   APPearance HAZY (A) CLEAR   Specific Gravity, Urine 1.012 1.005 - 1.030   pH 7.0 5.0 - 8.0   Glucose, UA 150 (A) NEGATIVE mg/dL   Hgb urine dipstick NEGATIVE NEGATIVE   Bilirubin Urine NEGATIVE NEGATIVE   Ketones, ur 20 (A) NEGATIVE  mg/dL   Protein, ur NEGATIVE NEGATIVE mg/dL   Nitrite NEGATIVE NEGATIVE   Leukocytes, UA NEGATIVE NEGATIVE  Comprehensive metabolic panel     Status: Abnormal   Collection Time: 10/24/16 12:23 PM  Result Value Ref Range   Sodium 135 135 - 145 mmol/L   Potassium 3.7 3.5 - 5.1 mmol/L   Chloride 107 101 - 111 mmol/L   CO2 21 (L) 22 - 32 mmol/L   Glucose, Bld 111 (H) 65 - 99 mg/dL   BUN 6 6 - 20 mg/dL   Creatinine, Ser 0.40 (L) 0.44 - 1.00 mg/dL   Calcium 8.4 (L) 8.9 - 10.3 mg/dL   Total Protein 6.1 (L) 6.5 - 8.1 g/dL   Albumin 2.9 (L) 3.5 - 5.0 g/dL   AST 26 15 - 41 U/L   ALT 18 14 - 54 U/L   Alkaline Phosphatase 57 38 - 126 U/L   Total Bilirubin 0.4 0.3 - 1.2 mg/dL   GFR calc non Af Amer >60 >60 mL/min   GFR calc Af Amer >60 >60 mL/min    Comment: (NOTE) The eGFR has been calculated using the CKD EPI equation. This calculation has not been validated in all clinical situations. eGFR's persistently <60 mL/min signify possible Chronic Kidney Disease.    Anion gap 7 5 - 15   Treatments: Nebulizer tx and IV steroids , PO kdur  Discharge Exam: Blood pressure 110/77, pulse 88, temperature 97.8 F (36.6 C), temperature source Oral, resp. rate 18, height _0  (1.575 m), weight 88.5 kg (195 lb), last menstrual period 01/15/2016, SpO2 96 %. General appearance: alert and cooperative Head: Normocephalic, without obvious abnormality, atraumatic Lungs : wheezing bilateral  CV RRR  Abd : soft NT  Fetal NST : reactive on DOD  Disposition: 01-Home or Self Care  Discharge Instructions    Call MD for:    Complete by:  As directed    SOB , increasing Wheezeing   Increasing SOB / [place]   Call MD for:  difficulty breathing, headache or visual disturbances    Complete by:  As directed    Call MD for:  extreme fatigue    Complete by:  As directed    Call MD for:  hives    Complete by:  As directed    Call MD for:  persistant dizziness or light-headedness    Complete by:  As directed     Call MD for:  persistant nausea and vomiting    Complete by:  As directed    Call MD for:  redness, tenderness, or signs of infection (pain, swelling, redness, odor or green/yellow discharge around incision site)    Complete by:  As directed    Call MD for:  severe uncontrolled pain    Complete by:  As directed    Call MD for:  temperature >100.4    Complete by:  As directed    Diet - low sodium heart healthy    Complete by:  As directed    Increase activity slowly    Complete by:  As directed  Allergies as of 10/24/2016      Reactions   Vicodin [hydrocodone-acetaminophen] Itching      Medication List    STOP taking these medications   albuterol 108 (90 Base) MCG/ACT inhaler Commonly known as:  PROVENTIL HFA;VENTOLIN HFA Replaced by:  albuterol (2.5 MG/3ML) 0.083% nebulizer solution You also have another medication with the same name that you need to continue taking as instructed.   benzonatate 100 MG capsule Commonly known as:  TESSALON PERLES   ipratropium-albuterol 0.5-2.5 (3) MG/3ML Soln Commonly known as:  DUONEB     TAKE these medications   albuterol 108 (90 Base) MCG/ACT inhaler Commonly known as:  PROVENTIL HFA;VENTOLIN HFA Inhale 2 puffs into the lungs every 6 (six) hours as needed for wheezing or shortness of breath. What changed:  Another medication with the same name was added. Make sure you understand how and when to take each.  Another medication with the same name was removed. Continue taking this medication, and follow the directions you see here.   albuterol (2.5 MG/3ML) 0.083% nebulizer solution Commonly known as:  PROVENTIL Take 3 mLs (2.5 mg total) by nebulization every 6 (six) hours as needed for wheezing or shortness of breath. What changed:  You were already taking a medication with the same name, and this prescription was added. Make sure you understand how and when to take each. Replaces:  albuterol 108 (90 Base) MCG/ACT inhaler    beclomethasone 40 MCG/ACT inhaler Commonly known as:  QVAR Inhale 1 puff into the lungs 2 (two) times daily.   guaiFENesin 100 MG/5ML Soln Commonly known as:  ROBITUSSIN Take 10 mLs (200 mg total) by mouth 4 (four) times daily.   ondansetron 8 MG tablet Commonly known as:  ZOFRAN Take 1 tablet (8 mg total) by mouth every 8 (eight) hours as needed for nausea or vomiting.   potassium chloride 10 MEQ tablet Commonly known as:  K-DUR Take 1 tablet (10 mEq total) by mouth 2 (two) times daily.   predniSONE 5 MG tablet Commonly known as:  DELTASONE 8 tabs po day1; 7 tabs po day2; 6 tabs po day3; 5 tabs po day4; 4 tabs po day5; 3 tabs po day6; 2 tabs po day7; 1 tab po day8 What changed:  medication strength  additional instructions  Another medication with the same name was removed. Continue taking this medication, and follow the directions you see here.   PRENATAL PO Take 1 tablet by mouth daily.            Durable Medical Equipment        Start     Ordered   10/23/16 [phone]  For home use only DME Nebulizer machine  Once    Question:  Patient needs a nebulizer to treat with the following condition  Answer:  Asthma affecting pregnancy, antepartum   10/23/16 0951     Follow-up Information    Lataunya Ruud, MD Follow up.   Specialty:  Obstetrics and Gynecology Why:  keep appointment on tuesday Contact information: [address] [place] [address] [phone]           Signed: Laverta [place] 10/24/2016, 5:16 PM

## 2016-10-24 NOTE — Care Management Note (Signed)
Case Management Note  Patient Details  Name: [name] MRN: [id number] Date of Birth: [birth year]  Subjective/Objective:   A request for a home nebulizer machine to be delivered to [facility] room 337 today was called to [place] [facility].                  Action/Plan:   Expected Discharge Date:  10/24/16               Expected Discharge Plan:     In-House Referral:     Discharge planning Services     Post Acute Care Choice:    Choice offered to:     DME Arranged:    DME Agency:     HH Arranged:    HH Agency:     Status of Service:     If discussed at [employer], dates discussed:    Additional Comments:  Laden Fieldhouse A, RN 10/24/2016, 2:36 PM

## 2016-10-24 NOTE — Progress Notes (Signed)
Patient ID: Brandi Fleming, female   DOB: September 26, 1985, 31 y.o.   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  Eyes: Negative for blurred vision.  Respiratory: Positive for cough, shortness of breath and wheezing.   Cardiovascular: Negative for chest pain.  Gastrointestinal: Negative for abdominal pain, constipation, diarrhea, nausea and vomiting.  Genitourinary: Negative for dysuria.  Musculoskeletal: Negative for joint pain.  Neurological: Negative for dizziness and headaches.   Exam: Physical Exam  HENT:  Nose: No mucosal edema.  Mouth/Throat: No oropharyngeal exudate or posterior oropharyngeal edema.  Eyes: Conjunctivae, EOM and lids are normal. Pupils are equal, round, and reactive to light.  Neck: No JVD present. Carotid bruit is not present. No edema present. No thyroid mass and no thyromegaly present.  Cardiovascular: S1 normal and S2 normal.  Exam reveals no gallop.   No murmur heard. Pulses:      Dorsalis pedis pulses are 2+ on the right side, and 2+ on the left side.  Respiratory: No respiratory distress. She has decreased breath sounds in the right lower field and the left lower field. She has wheezes in the right upper field and the left upper field. She has no rhonchi. She has no rales.  GI: Soft. Bowel sounds are normal. There is no tenderness.  Musculoskeletal:       Right ankle: She exhibits no swelling.       Left ankle: She exhibits no swelling.  Lymphadenopathy:    She has no  cervical adenopathy.  Neurological: She is alert. No cranial nerve deficit.  Skin: Skin is warm. No rash noted. Nails show no clubbing.  Psychiatric: She has a normal mood and affect.      Data Reviewed: Basic Metabolic Panel:  Recent Labs Lab 10/22/16 1705 10/23/16 0055 10/24/16 1223  NA 132* 134* 135  K 2.9* 3.2* 3.7  CL 102 105 107  CO2 23 21* 21*  GLUCOSE 89 160* 111*  BUN <5* <5* 6  CREATININE <0.30* 0.35* 0.40*  CALCIUM 8.0* 8.2* 8.4*  MG  --  1.8  --    Liver Function Tests:  Recent Labs Lab 10/22/16 1705 10/23/16 0055 10/24/16 1223  AST 19 36 26  ALT ALKPHOS 60 63 57  BILITOT 1.3* 1.2 0.4  PROT 6.0* 6.4* 6.1*  ALBUMIN 3.0* 3.1* 2.9*   CBC:  Recent Labs Lab 10/22/16 1705 10/23/16 0055  WBC 10.5 11.0  NEUTROABS 9.6*  --   HGB 11.9* 12.0  HCT 33.8* 33.2*  MCV 89.2 88.5  PLT 176 200   Cardiac Enzymes:  Recent Labs Lab 10/22/16 1705  TROPONINI <0.03   BNP (last 3 results)  Recent Labs  10/22/16 1705  BNP 64.0      Studies: Dg Chest 1 View  Result Date: 10/22/2016 CLINICAL DATA:  Shortness of breath.  History of asthma EXAM: CHEST 1 VIEW COMPARISON:  September 26, 2016 FINDINGS: Lungs are clear. Heart is upper normal  in size with pulmonary vascularity within normal limits. No adenopathy. No bone lesions. No pneumothorax. IMPRESSION: No edema or consolidation. Electronically Signed   By: Bretta Bang III M.D.   On: 10/22/2016 17:26    Scheduled Meds: . budesonide (PULMICORT) nebulizer solution  0.5 mg Nebulization BID  . docusate sodium  100 mg Oral Daily  . ipratropium-albuterol  3 mL Nebulization Q6H  . methylPREDNISolone (SOLU-MEDROL) injection  60 mg Intravenous Q12H  . potassium chloride  20 mEq Oral TID  . prenatal multivitamin  1 tablet Oral Q1200    Assessment/Plan:   1. Asthma exacerbation in pregnant patient. Patient feeling like she is breathing better and would like to go home today. Switch Solu-Medrol over  to a prednisone taper. Since this is the second hospitalization for the patient I will do a longer prednisone taper. Patient will need a steroid inhaler and I prescribed Qvar. I prescribed a nebulizer and albuterol nebulizer. She has an albuterol inhaler at home. I advised the patient if she starts feeling worse as she starts tapering down the steroid, may need to go back on a higher dose of the steroid and even a longer taper. 2. Hypokalemia and hypomagnesemia. These were replaced during the hospital course. Likely poor appetite contributing. 3. Hyponatremia now back in the normal range 4. Impaired fasting glucose secondary to steroids  Code Status:     Code Status Orders        Start     Ordered   10/22/16 2302  Full code  Continuous     10/22/16 2302    Code Status History    Date Active Date Inactive Code Status Order ID Comments User Context   10/22/2016  8:16 PM 10/22/2016 11:02 PM Full Code [id number]  Suzy Bouchard, MD ED   09/26/2016 12:06 PM 09/28/2016  4:03 PM Full Code [id number]  Sharee Pimple, CNM ED      Disposition Plan: Can go home today  Consultants:  Gynecology  Time spent: 35 minutes  Alford Highland  [employer]

## 2016-10-24 NOTE — Discharge Instructions (Signed)

## 2016-10-27 LAB — BLOOD GAS, VENOUS
ACID-BASE DEFICIT: 0 mmol/L (ref 0.0–2.0)
Bicarbonate: 23.8 mmol/L (ref 20.0–28.0)
O2 Saturation: 79.6 %
PATIENT TEMPERATURE: 37
PCO2 VEN: 35 mmHg — AB (ref 44.0–60.0)
pH, Ven: 7.44 — ABNORMAL HIGH (ref 7.250–7.430)
pO2, Ven: 42 mmHg (ref 32.0–45.0)

## 2016-10-29 ENCOUNTER — Inpatient Hospital Stay
Admission: EM | Admit: 2016-10-29 | Discharge: 2016-10-31 | DRG: 781 | Disposition: A | Discharge status: Home / Self Care | Payer: Medicaid Other | Attending: Obstetrics & Gynecology | Admitting: Obstetrics & Gynecology

## 2016-10-29 DIAGNOSIS — J45909 Unspecified asthma, uncomplicated: Secondary | ICD-10-CM | POA: Diagnosis present

## 2016-10-29 DIAGNOSIS — O99613 Diseases of the digestive system complicating pregnancy, third trimester: Secondary | ICD-10-CM | POA: Diagnosis present

## 2016-10-29 DIAGNOSIS — K219 Gastro-esophageal reflux disease without esophagitis: Secondary | ICD-10-CM | POA: Diagnosis not present

## 2016-10-29 DIAGNOSIS — Z6791 Unspecified blood type, Rh negative: Secondary | ICD-10-CM

## 2016-10-29 DIAGNOSIS — H6691 Otitis media, unspecified, right ear: Secondary | ICD-10-CM | POA: Diagnosis present

## 2016-10-29 DIAGNOSIS — E669 Obesity, unspecified: Secondary | ICD-10-CM | POA: Diagnosis present

## 2016-10-29 DIAGNOSIS — Z6833 Body mass index (BMI) 33.0-33.9, adult: Secondary | ICD-10-CM

## 2016-10-29 DIAGNOSIS — O99513 Diseases of the respiratory system complicating pregnancy, third trimester: Principal | ICD-10-CM | POA: Diagnosis present

## 2016-10-29 DIAGNOSIS — O99519 Diseases of the respiratory system complicating pregnancy, unspecified trimester: Secondary | ICD-10-CM | POA: Diagnosis not present

## 2016-10-29 DIAGNOSIS — O99213 Obesity complicating pregnancy, third trimester: Secondary | ICD-10-CM | POA: Diagnosis present

## 2016-10-29 DIAGNOSIS — J4541 Moderate persistent asthma with (acute) exacerbation: Secondary | ICD-10-CM | POA: Diagnosis not present

## 2016-10-29 DIAGNOSIS — Z3A30 30 weeks gestation of pregnancy: Secondary | ICD-10-CM

## 2016-10-29 DIAGNOSIS — O26893 Other specified pregnancy related conditions, third trimester: Secondary | ICD-10-CM | POA: Diagnosis present

## 2016-10-29 DIAGNOSIS — J45901 Unspecified asthma with (acute) exacerbation: Secondary | ICD-10-CM | POA: Diagnosis present

## 2016-10-29 DIAGNOSIS — O9989 Other specified diseases and conditions complicating pregnancy, childbirth and the puerperium: Secondary | ICD-10-CM | POA: Diagnosis present

## 2016-10-29 DIAGNOSIS — Z87891 Personal history of nicotine dependence: Secondary | ICD-10-CM

## 2016-10-29 MED ORDER — ARFORMOTEROL TARTRATE 15 MCG/2ML IN NEBU
15.0000 ug | INHALATION_SOLUTION | Freq: Two times a day (BID) | RESPIRATORY_TRACT | Status: DC
  Administered 2016-10-29 – 2016-10-31 (×4): 15 ug via RESPIRATORY_TRACT
  Filled 2016-10-29 (×5): qty 2

## 2016-10-29 MED ORDER — IPRATROPIUM-ALBUTEROL 0.5-2.5 (3) MG/3ML IN SOLN
3.0000 mL | Freq: Once | RESPIRATORY_TRACT | Status: AC
  Administered 2016-10-29: 3 mL via RESPIRATORY_TRACT
  Filled 2016-10-29: qty 3

## 2016-10-29 MED ORDER — FAMOTIDINE 20 MG PO TABS
20.0000 mg | ORAL_TABLET | Freq: Two times a day (BID) | ORAL | Status: DC
  Administered 2016-10-29 – 2016-10-31 (×4): 20 mg via ORAL
  Filled 2016-10-29 (×4): qty 1

## 2016-10-29 MED ORDER — GUAIFENESIN-DM 100-10 MG/5ML PO SYRP
5.0000 mL | ORAL_SOLUTION | ORAL | Status: DC | PRN
  Administered 2016-10-31: 5 mL via ORAL
  Filled 2016-10-29 (×2): qty 5

## 2016-10-29 MED ORDER — ENOXAPARIN SODIUM 40 MG/0.4ML ~~LOC~~ SOLN
40.0000 mg | SUBCUTANEOUS | Status: DC
  Administered 2016-10-29 – 2016-10-30 (×2): 40 mg via SUBCUTANEOUS
  Filled 2016-10-29 (×3): qty 0.4

## 2016-10-29 MED ORDER — BUDESONIDE 0.25 MG/2ML IN SUSP
0.2500 mg | Freq: Once | RESPIRATORY_TRACT | Status: AC
  Administered 2016-10-29: 0.25 mg via RESPIRATORY_TRACT
  Filled 2016-10-29: qty 2

## 2016-10-29 MED ORDER — TIOTROPIUM BROMIDE MONOHYDRATE 18 MCG IN CAPS
18.0000 ug | ORAL_CAPSULE | Freq: Every day | RESPIRATORY_TRACT | Status: DC
  Administered 2016-10-29 – 2016-10-31 (×3): 18 ug via RESPIRATORY_TRACT
  Filled 2016-10-29: qty 5

## 2016-10-29 MED ORDER — WITCH HAZEL-GLYCERIN EX PADS: MEDICATED_PAD | CUTANEOUS | Status: DC | PRN

## 2016-10-29 MED ORDER — CEFUROXIME AXETIL 500 MG PO TABS
500.0000 mg | ORAL_TABLET | Freq: Two times a day (BID) | ORAL | Status: DC
  Administered 2016-10-29 – 2016-10-31 (×4): 500 mg via ORAL
  Filled 2016-10-29 (×4): qty 1

## 2016-10-29 MED ORDER — ALBUTEROL SULFATE (2.5 MG/3ML) 0.083% IN NEBU: 2.5000 mg | INHALATION_SOLUTION | RESPIRATORY_TRACT | Status: DC | PRN

## 2016-10-29 MED ORDER — SENNA 8.6 MG PO TABS
1.0000 | ORAL_TABLET | Freq: Every day | ORAL | Status: DC
  Administered 2016-10-30 – 2016-10-31 (×2): 8.6 mg via ORAL
  Filled 2016-10-29 (×2): qty 1

## 2016-10-29 MED ORDER — METHYLPREDNISOLONE SODIUM SUCC 125 MG IJ SOLR
80.0000 mg | Freq: Two times a day (BID) | INTRAMUSCULAR | Status: DC
  Administered 2016-10-29 – 2016-10-31 (×4): 80 mg via INTRAVENOUS
  Filled 2016-10-29 (×4): qty 2

## 2016-10-29 MED ORDER — BUDESONIDE 0.5 MG/2ML IN SUSP
0.5000 mg | Freq: Two times a day (BID) | RESPIRATORY_TRACT | Status: DC
  Administered 2016-10-29 – 2016-10-31 (×4): 0.5 mg via RESPIRATORY_TRACT
  Filled 2016-10-29 (×5): qty 2

## 2016-10-29 NOTE — Consult Note (Signed)
Name: Brandi Fleming MRN: [id number] DOB: [birth year]    ADMISSION DATE:  10/29/2016 CONSULTATION DATE:  10/29/2016  REFERRING MD :  Milon Score, CNM  CHIEF COMPLAINT:  Elevated outpatient PFT's  BRIEF PATIENT DESCRIPTION:  31 yo [redacted]w[redacted]d pregnant female admitted 04/20 with asthma exacerbation   SIGNIFICANT EVENTS  04/20-Pt admitted to Mother/Baby Unit   STUDIES:  None  HISTORY OF PRESENT ILLNESS:   This is a 31 yo female with a PMH of Former smoker (quit date 06/2016), Bipolar I disorder, Anemia, Depression, Asthma (dx in ER based on symptoms 1 yr ago).  She presented to [facility] on 04/20 per recommendation of her nurse midwife and pulmonologist for treatment of asthma exacerbation.  The pt had a hospital follow-up appointment today 04/20 and during her appointment she was briefly seen by Pulmonologist Dr. Meredeth Ide who recommended the pt get PFT's.  Based on abnormal PFT results Dr. Meredeth Ide recommended the pt come to the hospital for admission and management of asthma.  The pt has been admitted to the hospital a total of 2 times since March 2018 prior to this admission with cough and shortness of breath secondary to asthma.  During previous hospitalizations she completed a course of abx, iv steroids, and bronchodilator therapy, and was most recently discharged on a prednisone taper, scheduled qvar, and prn albuterol inhaler and neb treatments, however despite interventions her symptoms continue to persist.  She is still completing her prednisone taper her most recent dose was this am 04/20, and she is currently taking her Qvar as prescribed.  She states "this is my third time being hospitalized for the same thing and I just want to get better."  Prior to this pregnancy she has not had issues with asthma exacerbations since her first exacerbation 1 year ago when she was diagnosed based on symptoms for the first time by an ER physician.  She also complains of right flank pain onset 1 month ago, which  she was prescribed flexeril without relief of pain.  PCCM contacted 04/20 for management of asthma.  PAST MEDICAL HISTORY :   has a past medical history of Asthma and Bipolar 1 disorder (HCC).  has no past surgical history on file. Prior to Admission medications   Medication Sig Start Date End Date Taking? Authorizing Provider  albuterol (PROVENTIL HFA;VENTOLIN HFA) 108 (90 Base) MCG/ACT inhaler Inhale 2 puffs into the lungs every 6 (six) hours as needed for wheezing or shortness of breath. 11/28/15   Tommi Rumps, PA-C  albuterol (PROVENTIL) (2.5 MG/3ML) 0.083% nebulizer solution Take 3 mLs (2.5 mg total) by nebulization every 6 (six) hours as needed for wheezing or shortness of breath. 10/24/16   Alford Highland, MD  beclomethasone (QVAR) 40 MCG/ACT inhaler Inhale 1 puff into the lungs 2 (two) times daily. 10/24/16   Alford Highland, MD  guaiFENesin (ROBITUSSIN) 100 MG/5ML SOLN Take 10 mLs (200 mg total) by mouth 4 (four) times daily. 09/28/16   Ihor Austin Schermerhorn, MD  ondansetron (ZOFRAN) 8 MG tablet Take 1 tablet (8 mg total) by mouth every 8 (eight) hours as needed for nausea or vomiting. 10/24/16   Ihor Austin Schermerhorn, MD  potassium chloride (K-DUR) 10 MEQ tablet Take 1 tablet (10 mEq total) by mouth 2 (two) times daily. 10/24/16   Ihor Austin Schermerhorn, MD  predniSONE (DELTASONE) 5 MG tablet 8 tabs po day1; 7 tabs po day2; 6 tabs po day3; 5 tabs po day4; 4 tabs po day5; 3 tabs po day6; 2 tabs  po day7; 1 tab po day8 10/24/16   Alford Highland, MD  Prenatal Vit-Fe Fumarate-FA (PRENATAL PO) Take 1 tablet by mouth daily.    Historical Provider, MD   Allergies  Allergen Reactions  . Vicodin [Hydrocodone-Acetaminophen] Itching    FAMILY HISTORY:  family history is not on file. SOCIAL HISTORY:  reports that she has quit smoking. She has never used smokeless tobacco. She reports that she has current or past drug history. She reports that she does not drink alcohol.  REVIEW OF SYSTEMS:   Positives in BOLD  Constitutional: Negative for fever, chills, weight loss, malaise/fatigue and diaphoresis.  HENT: right ear hearing loss, right ear pain, nosebleeds, congestion, sore throat, neck pain, tinnitus and ear discharge.   Eyes: Negative for blurred vision, double vision, photophobia, pain, discharge and redness.  Respiratory: cough, hemoptysis, sputum production, shortness of breath, wheezing and stridor.   Cardiovascular: Negative for chest pain, palpitations, orthopnea, claudication, leg swelling and PND.  Gastrointestinal: heartburn, nausea, vomiting, abdominal pain, diarrhea, constipation, blood in stool and melena.  Genitourinary: Negative for dysuria, urgency, frequency, hematuria and right flank pain.  Musculoskeletal: Negative for myalgias, back pain, joint pain and falls.  Skin: Negative for itching and rash.  Neurological: Negative for dizziness, tingling, tremors, sensory change, speech change, focal weakness, seizures, loss of consciousness, weakness and headaches.  Endo/Heme/Allergies: Negative for environmental allergies and polydipsia. Does not bruise/bleed easily.  SUBJECTIVE:  Pt states she is short of breath and "tired of being sick." She is frustrated that her cough and shortness of breath are not improving despite treatment.  The pt stated she was suppose to go to the beach today and had to cancel her plans because she was told to come to the hospital.  VITAL SIGNS: Temp:  [97.8 F (36.6 C)] 97.8 F (36.6 C) (04/20 1448) Pulse Rate:  [84] 84 (04/20 1448) Resp:  [20] 20 (04/20 1448) BP: (129)/(73) 129/73 (04/20 1448) SpO2:  [97 %] 97 % (04/20 1448)  PHYSICAL EXAMINATION: General: well developed, well nourished, Caucasian female Neuro:  alert and oriented, follow commands HEENT: supple, no JVD Cardiovascular: s1s2, rrr, no M/R/G Lungs: diffuse inspiratory and expiratory wheezes throughout, even, non labored on RA Abdomen: [redacted]w[redacted]d gravid, non tender, +BS  x4 Musculoskeletal: normal bulk and tone, no edema Skin: intact no rashes or lesions   Recent Labs Lab 10/22/16 1705 10/23/16 0055 10/24/16 1223  NA 132* 134* 135  K 2.9* 3.2* 3.7  CL 102 105 107  CO2 23 21* 21*  BUN <5* <5* 6  CREATININE <0.30* 0.35* 0.40*  GLUCOSE 89 160* 111*    Recent Labs Lab 10/22/16 1705 10/23/16 0055  HGB 11.9* 12.0  HCT 33.8* 33.2*  WBC 10.5 11.0  PLT 176 200   No results found.  CXR 04/13>>No edema or consolidation.  ASSESSMENT / PLAN: Asthma exacerbation in [redacted]w[redacted]d gravid patient  P: Supplemental O2 to maintain O2 sats >92% or for dyspnea Scheduled and prn bronchodilator therapy Scheduled nebulized steroids Systemic steroids wean as tolerated Will start oral pepcid for heartburn  OBGYN stated they would start abx for suspected otitis media, however I will not add any additional abx treatment at this time respiratory symptoms are unlikely due to an infectious process  CXR on 04/13 reviewed results within normal limits will not repeat CXR at this time to limit radiation exposure   Sonda Rumble, [facility]  Pulmonary/Critical Care Pager [phone] (please enter [phone] digits) PCCM Consult Pager [phone] (please enter 7 digits)   PCCM  ATTENDING ATTESTATION:  I have evaluated patient with the APP [place], reviewed database in its entirety and discussed care plan in detail. In addition, this patient was discussed on multidisciplinary rounds. 27 pregnant F former smoker ([redacted] wks gestation) with third admission in past 2 weeks with asthma. She never had asthma as a child, suffered one episode of what was called asthma as an adult, was only treated with PRN albuterol (which she never used) who developed dyspnea, cough, B flank pain, wheezing. She was treated with steroids and bronchodilators on the two previous occasions during these past 2 wks with initial improvement only to relapse after prednisone was tapered to off. She went for spirometry today and  was noted to be wheezing, therefore admitted. She is in no distress @ rest on RA. Her chest exam reveals very coarse bilateral wheezes throughout. Her spirometry earlier today revealed moderate obstruction    IMP: Acute bronchospasm/asthma exacerbation - it is not clear to me why she has developed refractory asthma in the past couple of weeks. Possible exacerbating factors might be GERD or chronic rhinosinusitis. She reports R ear fullness. She states that she has HB sx "all the time". She insists that she is not smoking.   PLAN/REC: Nebulized steroids and bronchodilators as ordered Methylprednisolone as ordered Famotidine ordered She should probably remain hospitalized for 2-3 days to get things moving back in the right direction Most importantly, she will need a thoughtful discharge plan to prevent re-admission. I will check on 04/21   Billy Fischer, MD PCCM service Mobile [phone] Pager [phone] 10/29/2016 6:23 PM

## 2016-10-29 NOTE — H&P (Signed)
H&P for admission on 10/29/16 Brandi Fleming is a 31 y.o. femaleG2P1 presenting for worsening SOB  And wheezing which has not improved over 2 months now after 2 hospital admits and taking daily steroids, using Albuterol inhaler and xoponex treatments. She also complains today of "chronic coughing that is causing her ribs and upper back to hurt due to the muscle spasm. She is  30  weeks  By dating with LMP of 03/31/16 with EDD of 01/05/17. Marland Kitchen Pt was previously admitted 3 weeks ago and underwent tx with inhaler, ABX and steroids . Pt initially improved but now is still with persistent cough . Pt was also admitted on 10/22/16 and tx with steroids, ABX and inhaler and was discharged and not improved.   Problems in this pregnancy Factors complicating this pregnancy   Hx. Of Gestational HTN with last pregnancy  Obesity BMI 33  RH Negative   Social: FOB in jail, patient anxious/depressed about this. Has hx of bipolar but never taken meds for it.  Asthma - using Albuterol inhaler   persistent cough , hospitalization 09/2016  [facility] 10/22/16   Screening results and needs:  NOB:   MBT O- Ab screen Neg Pap HIV Neg Hep B/RPR Neg/NR  Rubella Immune VZV Immune PIH labs normal, GC/C neg, Early glucola 105 Past Medical History:  Diagnosis Date  . Asthma   . Bipolar 1 disorder (HCC)    History reviewed. No pertinent surgical history. Family History: family history is not on file. Social History:  reports that she has quit smoking. She has never used smokeless tobacco. She reports that she has current or past drug history. She reports that she does not drink alcohol.     Maternal Diabetes: not done Genetic Screening: too late Maternal Ultrasounds/Referrals: not done Fetal Ultrasounds or other Referrals: not doneMaternal Substance Abuse:  None documented Significant Maternal Medications:  albuterolSignificant Maternal Lab Results:  Recent Hypokalemia Other Comments: CXR negative  10/22/16  ROS ROS : pyschiatry : bipolar d/c  General : obesity Piulmonary : asthma with RAD requiring steroids  Cardiology: h/o gestational hypertension  Endocrine : no thyroid d/o  Genitourinary : no STD  m/s : nohx muscle weakness Neurologic : no seizure   History Blood pressure (!) 99/45, pulse (!) 123, temperature 99.1 F (37.3 C), temperature source Oral, resp. rate (!) 21, height  (1.575 m), weight 88.5 kg (195 lb), last menstrual period 01/15/2016, SpO2 97 %. Exam  HEENT normocephalic Perrla Neuro: CN intact  Lungs: + wheezes , BS decreased at bases CV tachycardia without murmur ADb: soft NT + bs , gravid . FHT : 140 at 1400 today Pelvic deferred M/S strength normal throughout  Physical Exam  Prenatal labs: ABO, RhO neg Antibody: Neg Rubella:imm RPR: neg HBsAg:neg HIV: Non Reactive (03/18 1218)  GBS:not done  Assessment/Plan: Worsening RAD , sob and cough  No evidence of pneumonia  Hypokalemia Admission and consult Hospitalist service to manage pulmonary issues. Pulmonology consult after speaking with Hospitalist Dr Shaune Spittle and Dr Minerva Areola and they advised calling PULM. They were paged. Sharee Pimple, RN, MSN, CNM, FNP

## 2016-10-30 DIAGNOSIS — Z6833 Body mass index (BMI) 33.0-33.9, adult: Secondary | ICD-10-CM | POA: Diagnosis not present

## 2016-10-30 DIAGNOSIS — R0602 Shortness of breath: Secondary | ICD-10-CM | POA: Diagnosis present

## 2016-10-30 DIAGNOSIS — Z6791 Unspecified blood type, Rh negative: Secondary | ICD-10-CM | POA: Diagnosis not present

## 2016-10-30 DIAGNOSIS — O99613 Diseases of the digestive system complicating pregnancy, third trimester: Secondary | ICD-10-CM | POA: Diagnosis present

## 2016-10-30 DIAGNOSIS — O99213 Obesity complicating pregnancy, third trimester: Secondary | ICD-10-CM | POA: Diagnosis present

## 2016-10-30 DIAGNOSIS — J45901 Unspecified asthma with (acute) exacerbation: Secondary | ICD-10-CM | POA: Diagnosis present

## 2016-10-30 DIAGNOSIS — H6691 Otitis media, unspecified, right ear: Secondary | ICD-10-CM | POA: Diagnosis present

## 2016-10-30 DIAGNOSIS — O99513 Diseases of the respiratory system complicating pregnancy, third trimester: Secondary | ICD-10-CM | POA: Diagnosis present

## 2016-10-30 DIAGNOSIS — J45909 Unspecified asthma, uncomplicated: Secondary | ICD-10-CM | POA: Diagnosis not present

## 2016-10-30 DIAGNOSIS — K219 Gastro-esophageal reflux disease without esophagitis: Secondary | ICD-10-CM | POA: Diagnosis not present

## 2016-10-30 DIAGNOSIS — O26893 Other specified pregnancy related conditions, third trimester: Secondary | ICD-10-CM | POA: Diagnosis present

## 2016-10-30 DIAGNOSIS — Z87891 Personal history of nicotine dependence: Secondary | ICD-10-CM | POA: Diagnosis not present

## 2016-10-30 DIAGNOSIS — J4541 Moderate persistent asthma with (acute) exacerbation: Secondary | ICD-10-CM | POA: Diagnosis not present

## 2016-10-30 DIAGNOSIS — Z3A30 30 weeks gestation of pregnancy: Secondary | ICD-10-CM | POA: Diagnosis not present

## 2016-10-30 DIAGNOSIS — O99519 Diseases of the respiratory system complicating pregnancy, unspecified trimester: Secondary | ICD-10-CM | POA: Diagnosis not present

## 2016-10-30 DIAGNOSIS — E669 Obesity, unspecified: Secondary | ICD-10-CM | POA: Diagnosis present

## 2016-10-30 DIAGNOSIS — O9989 Other specified diseases and conditions complicating pregnancy, childbirth and the puerperium: Secondary | ICD-10-CM | POA: Diagnosis present

## 2016-10-30 MED ORDER — CALCIUM CARBONATE ANTACID 500 MG PO CHEW: 2.0000 | CHEWABLE_TABLET | ORAL | Status: DC | PRN

## 2016-10-30 MED ORDER — SODIUM CHLORIDE 0.9% FLUSH
3.0000 mL | Freq: Three times a day (TID) | INTRAVENOUS | Status: DC
  Administered 2016-10-30 – 2016-10-31 (×4): 3 mL via INTRAVENOUS

## 2016-10-30 MED ORDER — PRENATAL MULTIVITAMIN CH
1.0000 | ORAL_TABLET | Freq: Every day | ORAL | Status: DC
  Administered 2016-10-31: 1 via ORAL
  Filled 2016-10-30: qty 1

## 2016-10-30 NOTE — Progress Notes (Signed)
Feels some better No new complaints  Vitals:   10/29/16 2310 10/30/16 0314 10/30/16 0822 10/30/16 0831  BP: 131/73 129/69 111/60   Pulse: (!) 105 85 79   Resp: Temp: 97.9 F (36.6 C) 98.4 F (36.9 C) 97.6 F (36.4 C)   TempSrc: Oral Oral Oral   SpO2: 99% 97% 96% 97%  Weight:      Height:       NAD Coarse expiratory wheezes - somewhat improved Reg, no M Preg abdomen No LE edema No focal neuro deficits  BMP Latest Ref Rng & Units 10/24/2016 10/23/2016 10/22/2016  Glucose 65 - 99 mg/dL 604(V) 409(W) 89  BUN 6 - 20 mg/dL 6 <1(X) <9(J)  Creatinine 0.44 - 1.00 mg/dL 4.78(G) 9.56(O) <1.30(Q)  Sodium 135 - 145 mmol/L 135 134(L) 132(L)  Potassium 3.5 - 5.1 mmol/L 3.7 3.2(L) 2.9(L)  Chloride 101 - 111 mmol/L 107 105 102  CO2 22 - 32 mmol/L 21(L) 21(L) 23  Calcium 8.9 - 10.3 mg/dL 6.5(H) 8.4(O) 9.6(E)   CBC Latest Ref Rng & Units 10/23/2016 10/22/2016 09/26/2016  WBC 3.6 - 11.0 K/uL 11.0 10.5 8.6  Hemoglobin 12.0 - 16.0 g/dL 95.2 11.9(L) 11.9(L)  Hematocrit 35.0 - 47.0 % 33.2(L) 33.8(L) 33.5(L)  Platelets 150 - 440 K/uL 200 176 220   No new CXR  IMPRESSION: Pregnant female Acute asthma exacerbation GERD  PLAN/REC: Cont current Rx I will follow up 04/22  Billy Fischer, MD PCCM service Mobile [phone] Pager [phone] 10/30/2016

## 2016-10-30 NOTE — OB Triage Note (Signed)
Pt from Mother/Baby rm 336 for daily NST.  Monitors applied,  Will assess strip.

## 2016-10-30 NOTE — Discharge Summary (Signed)
Obstetric Discharge Summary Reason for Admission: iUP at 28 weeks with Asthma unresolved on Albuterol and breathing tx's at home Prenatal Procedures: NST Intrapartum Procedures: NOne Postpartum Procedures: none Complications-Operative and Postpartum: Unresolved exp wheezes after Albuterol and other meds Hemoglobin  Date Value Ref Range Status  10/23/2016 12.0 12.0 - 16.0 g/dL Final   HGB  Date Value Ref Range Status  03/22/2014 13.8 12.0 - 16.0 g/dL Final   HCT  Date Value Ref Range Status  10/23/2016 33.2 (L) 35.0 - 47.0 % Final  03/22/2014 42.2 35.0 - 47.0 % Final    Physical Exam:  General: alert, cooperative and appears stated age  HEENT:Head: normocephalic, Eyes: non-icteric,  Heart: S1S2, RRR, No W/R/R Lungs: exp wheezes, insp wheezes but, 75% improvement overall Uterus: Gravid Incision: NOne DVT Evaluation: Neg Homans Discharge Diagnoses: IUP at 28 weeks with unresolved asthma   Discharge Information: Date: 10/30/2016 Activity:  Diet: routine Medications: PNV and Tylenol, RX for Tussionex(pt cannot take Vicodin without itching but, states she can take Tussionex and will use Benadryl prior to the dose Condition: stable Instructions: FU with pulmonology as scheduled, take meds as scheduled and fu at [facility] clinic as scheduled Discharge to: home   Newborn Data: This patient has no babies on file. Home as an Antepartum pt.  Brandi Fleming 10/30/2016, 4:43 PM

## 2016-10-30 NOTE — Discharge Instructions (Signed)

## 2016-10-30 NOTE — Progress Notes (Addendum)
Brandi Fleming is a 31 y.o. G2P1 at [redacted]w[redacted]d asdmitted for Asthma which has not resolved in the past 2 months. Pt continues to have insp and exp wheezes throughout and has been on Steroids, 3 round of antibiotcs, Albuterol inhaler, breathing tx's without resolution of the S/S. Pt denies smoking and states she is taking her meds as prescribed.    Subjective: "I feel a little bit better but, not much"  Objective: BP 111/60 (BP Location: Right Arm)   Pulse 79   Temp 97.6 F (36.4 C) (Oral)   Resp 17   Ht  (1.575 m)   Wt 88.9 kg (196 lb)   LMP 01/15/2016   SpO2 97%   BMI 35.85 kg/m  No intake/output data recorded. No intake/output data recorded.  FHT:  FHR: NST  bpm, variability: Cat 1, mod variability, 2 accels 15 x 15 BPM,  accelerations:  Present,  decelerations:  Absent UC:   none     Labs: Lab Results  Component Value Date   WBC 11.0 10/23/2016   HGB 12.0 10/23/2016   HCT 33.2 (L) 10/23/2016   MCV 88.5 10/23/2016   PLT 200 10/23/2016    Assessment / Plan: IUP at 28 weeks with chronic asthma  Asthma, Cough, Wheezes P: Pulmonology to follow pt here 2. NST daily 3. Admission to continue till improved   Sharee Pimple 10/30/2016, 11:06 AM

## 2016-10-31 DIAGNOSIS — O99519 Diseases of the respiratory system complicating pregnancy, unspecified trimester: Secondary | ICD-10-CM

## 2016-10-31 DIAGNOSIS — J45909 Unspecified asthma, uncomplicated: Secondary | ICD-10-CM

## 2016-10-31 MED ORDER — CEFUROXIME AXETIL 500 MG PO TABS: 500.0000 mg | ORAL_TABLET | Freq: Two times a day (BID) | ORAL | 0 refills | Status: DC

## 2016-10-31 MED ORDER — HYDROCOD POLST-CPM POLST ER 10-8 MG/5ML PO SUER: 5.0000 mL | Freq: Two times a day (BID) | ORAL | 0 refills | Status: DC | PRN

## 2016-10-31 NOTE — Progress Notes (Signed)
Feels much better No new complaints  Vitals:   10/31/16 0106 10/31/16 0500 10/31/16 0736 10/31/16 0839  BP: (!) 143/85 130/70 116/68   Pulse: 94 87 83   Resp: Temp: 97.3 F (36.3 C) 98 F (36.7 C) 98 F (36.7 C)   TempSrc: Oral Oral Oral   SpO2: 99% 99% 99% 99%  Weight:      Height:       NAD Minimal scattered expiratory wheezes - much improved Reg, no M Preg abdomen No LE edema No focal neuro deficits  BMP Latest Ref Rng & Units 10/24/2016 10/23/2016 10/22/2016  Glucose 65 - 99 mg/dL 409(W) 119(J) 89  BUN 6 - 20 mg/dL 6 <4(N) <8(G)  Creatinine 0.44 - 1.00 mg/dL 9.56(O) 1.30(Q) <6.57(Q)  Sodium 135 - 145 mmol/L 135 134(L) 132(L)  Potassium 3.5 - 5.1 mmol/L 3.7 3.2(L) 2.9(L)  Chloride 101 - 111 mmol/L 107 105 102  CO2 22 - 32 mmol/L 21(L) 21(L) 23  Calcium 8.9 - 10.3 mg/dL 4.6(N) 6.2(X) 5.2(W)   CBC Latest Ref Rng & Units 10/23/2016 10/22/2016 09/26/2016  WBC 3.6 - 11.0 K/uL 11.0 10.5 8.6  Hemoglobin 12.0 - 16.0 g/dL 41.3 11.9(L) 11.9(L)  Hematocrit 35.0 - 47.0 % 33.2(L) 33.8(L) 33.5(L)  Platelets 150 - 440 K/uL 200 176 220   No new CXR  IMPRESSION: Pregnant female Acute asthma exacerbation - resolving HB/GERD - likely contributing to asthma  PLAN/REC: OK for discharge Recommend the following:asthma regimen  Prednisone taper 60 mg daily X 34, 40 mg X 3, 20 mg X3, stop  Nebulized budesonide (Pulmicort) 0.5 mg BID  Nebulized arformoterol (Brovana) 15 mcg BID  Tiotropium (Spiriva) - one inhalation daily  Famotidine 20 mg BID  Albuterol MDI - 2 inhalations q 4 hrs PRN wheezong, chest tightness, cough, dyspnea (first line)  Nebulized albuterol 2.5 mg q 4 hrs PRN wheezong, chest tightness, cough, dyspnea (second line)  I will schedule follow up with me in the office in 2-3 weeks  Billy Fischer, MD PCCM service Mobile [phone] Pager [phone] 10/31/2016

## 2016-10-31 NOTE — Progress Notes (Addendum)
DAURICE OVANDO is a 31 y.o. G2P1 at [redacted]w[redacted]d by dating C/W [place] and admitted for unresolved asthma and Albuterol inhaler not working. Pt had 2 prior admits and 3 runs of Antibiotics and still Having exp and insp wheezing througout. Pulmonary doctor  Now involved.   Subjective: "I feel the best I have"  Objective: BP (!) 155/82 (BP Location: Right Arm)   Pulse 92   Temp 98.1 F (36.7 C) (Oral)   Resp 20   Ht  (1.575 m)   Wt 88.9 kg (196 lb)   LMP 01/15/2016   SpO2 98%   BMI 35.85 kg/m  I/O last 3 completed shifts: In: 240 [P.O.:240] Out: -  No intake/output data recorded. Gen:30 yo white female in good spirits.  Heart: S1S2, RRR, No W/R/R. Lungs: Clear in some areas for the first time in months, Some insp and exp wheezes but, 75% improved overall. No retractions, Color is pink.  Voiding well, Taking po well. FHT:  155 UC:   None     NST reactive with 2 accels 15 x 15 BPM Labs: Lab Results  Component Value Date   WBC 11.0 10/23/2016   HGB 12.0 10/23/2016   HCT 33.2 (L) 10/23/2016   MCV 88.5 10/23/2016   PLT 200 10/23/2016    Assessment / Plan: A. 1. IUP at 28 2/7  Weeks.  2. Asthma unresolved with steroids and Albuterol. 3. RT otitis media 4. Cough P: 1. Continue Ceftin 500 mg po BID x 10days. 2. Disc how disease progression has occurred even with tx. Desire for pt to get clear in the lung flields before leaving. 3. Pulmonary team to see pt daily for now and fu after dc. Talked with Dr. Sung Amabile and he will dc pt home providing we can acces meds for her to take home. Supervisor aware and we will work on this need. Pt will fu weekly with me at [facility].    Sharee Pimple 10/31/2016, 12:31 AM

## 2016-10-31 NOTE — Progress Notes (Signed)
D/C instructions provided, pt states understanding.

## 2016-11-17 ENCOUNTER — Inpatient Hospital Stay: Payer: Medicaid Other | Admitting: Pulmonary Disease

## 2016-11-17 ENCOUNTER — Telehealth: Payer: Self-pay | Admitting: *Deleted

## 2016-11-17 NOTE — Telephone Encounter (Signed)
Cancelled appt for today for hosp f/u. DS asked me to give pt a call due to she needs to f/u in office. LMOM for pt to return call to get her rescheduled.

## 2016-11-18 NOTE — Telephone Encounter (Signed)
Spoke with pt and she states she will call back tomorrow to reschedule her appt she cancelled. States she cancelled because she had to pick up her son from school that was sick. Nothing further needed.

## 2016-11-26 ENCOUNTER — Observation Stay
Admission: EM | Admit: 2016-11-26 | Discharge: 2016-11-26 | Disposition: A | Discharge status: Home / Self Care | Payer: Medicaid Other | Attending: Obstetrics & Gynecology | Admitting: Obstetrics & Gynecology

## 2016-11-26 ENCOUNTER — Encounter: Payer: Self-pay | Admitting: *Deleted

## 2016-11-26 DIAGNOSIS — R42 Dizziness and giddiness: Secondary | ICD-10-CM | POA: Insufficient documentation

## 2016-11-26 DIAGNOSIS — Z3A33 33 weeks gestation of pregnancy: Secondary | ICD-10-CM | POA: Diagnosis not present

## 2016-11-26 DIAGNOSIS — Z87891 Personal history of nicotine dependence: Secondary | ICD-10-CM | POA: Insufficient documentation

## 2016-11-26 DIAGNOSIS — Z3490 Encounter for supervision of normal pregnancy, unspecified, unspecified trimester: Secondary | ICD-10-CM

## 2016-11-26 DIAGNOSIS — J45909 Unspecified asthma, uncomplicated: Secondary | ICD-10-CM | POA: Insufficient documentation

## 2016-11-26 DIAGNOSIS — O26893 Other specified pregnancy related conditions, third trimester: Principal | ICD-10-CM | POA: Insufficient documentation

## 2016-11-26 DIAGNOSIS — H8309 Labyrinthitis, unspecified ear: Secondary | ICD-10-CM | POA: Diagnosis not present

## 2016-11-26 DIAGNOSIS — O99513 Diseases of the respiratory system complicating pregnancy, third trimester: Secondary | ICD-10-CM | POA: Insufficient documentation

## 2016-11-26 MED ORDER — PROMETHAZINE HCL 25 MG PO TABS: 25.0000 mg | ORAL_TABLET | Freq: Four times a day (QID) | ORAL | 0 refills | Status: DC | PRN

## 2016-11-26 MED ORDER — MECLIZINE HCL 25 MG PO TABS
25.0000 mg | ORAL_TABLET | Freq: Once | ORAL | Status: AC
  Administered 2016-11-26: 25 mg via ORAL
  Filled 2016-11-26: qty 1

## 2016-11-26 MED ORDER — MECLIZINE HCL 25 MG PO TABS: 25.0000 mg | ORAL_TABLET | Freq: Three times a day (TID) | ORAL | 0 refills | Status: DC

## 2016-11-26 MED ORDER — PROMETHAZINE HCL 25 MG PO TABS
25.0000 mg | ORAL_TABLET | Freq: Once | ORAL | Status: AC
  Administered 2016-11-26: 25 mg via ORAL
  Filled 2016-11-26: qty 1

## 2016-11-26 NOTE — OB Triage Note (Signed)
Recvd pt from ED. C/O dizziness that started around 1600 and states she feels drunk and feels like everyone is spinning. States she feels some cramping. Felt baby last move at 1500. Says she went to work at 1700 and has been on her feet all day. States she is well hydrated and has thrown up twice since 1700 from the dizziness.

## 2016-11-26 NOTE — Discharge Instructions (Signed)
Labyrinthitis Labyrinthitis is an infection of the inner ear. Your inner ear is a system of tubes and canals (labyrinth). These are filled with fluid. Nerve cells in your inner ear send signals for hearing and balance to your brain. When tiny germs get inside the tubes and canals, they harm the cells that send messages to the brain. This can cause changes in hearing and balance. Follow these instructions at home:  Take medicines only as told by your doctor.  If you were prescribed an antibiotic medicine, finish all of it even if you start to feel better.  Rest as much as possible.  Avoid loud noises and bright lights.  Do not make sudden movements until any dizziness goes away.  Do not drive until your doctor says that you can.  Drink enough fluid to keep your pee (urine) clear or pale yellow.  Work with a physical therapist if you still feel dizzy after several weeks. A therapist can teach you exercises to help you deal with your dizziness.  Keep all follow-up visits as told by your doctor. This is important. Contact a doctor if:  Your symptoms do not get better with medicines.  You do not get better after two weeks.  You have a fever. Get help right away if:  You are very dizzy.  You keep throwing up (vomiting) or keep feeling sick to your stomach (nauseous).  Your hearing gets a lot worse very quickly. This information is not intended to replace advice given to you by your health care provider. Make sure you discuss any questions you have with your health care provider. Document Released: 06/28/2005 Document Revised: 12/04/2015 Document Reviewed: 04/09/2014 Elsevier Interactive Patient Education  2017 [employer]  

## 2016-11-26 NOTE — Discharge Summary (Signed)
Obstetric Discharge Summary   Patient ID: Brandi BorsJosie L Fleming MRN: [id number] DOB/AGE: 30/03/1986 30 y.o.  31 yo G2P1001 with unsure LMP of 03/31/16 & US at 12 5/7 weeks with EDD of 01/16/17. Pt has had 2 Inpatient hospital Admits this preg for Asthma.  Date of Admission: 11/26/2016  Date of Discharge: 11/26/16  Admitting Diagnosis: IUP  at 5019w2d  Secondary Diagnosis: Vertigo, labyrinthitis, Dizziness  Mode of Delivery: Not yet     Discharge Diagnosis: Labyrinthitis, Dizziness   Intrapartum Procedures: NST   Post partum procedures: Not delivered  Complications: Vertigo, Dizziness, Labyrinthitis  NST reactive with 2 accels 15 x 15 BPM  Brief Hospital Course  Devynn L Lorie Fleming is a G2P1 who had dizziness this pm while at work, vertigo and vomited with a HA.  Pt came to [facility] as she was concerned about this new symptom. She feels her head spinning and feels like she is unable to feel normal. Feels out of balance also.   Labs: CBC Latest Ref Rng & Units 10/23/2016 10/22/2016 09/26/2016  WBC 3.6 - 11.0 K/uL 11.0 10.5 8.6  Hemoglobin 12.0 - 16.0 g/dL 78.212.0 11.9(L) 11.9(L)  Hematocrit 35.0 - 47.0 % 33.2(L) 33.8(L) 33.5(L)  Platelets 150 - 440 K/uL 200 176 220   O NEG Past Medical History:  Diagnosis Date  . Asthma   . Bipolar 1 disorder (HCC)   Abnormal Pap with colp age 31 Past Surgical History:  Procedure Laterality Date  . WISDOM TOOTH EXTRACTION    History reviewed. No pertinent family history.  Social History   Social History  . Marital status: Single    Spouse name: N/A  . Number of children: N/A  . Years of education: N/A   Occupational History  . Not on file.   Social History Main Topics  . Smoking status: Former Games developermoker  . Smokeless tobacco: Never Used  . Alcohol use No  . Drug use: Unknown  . Sexual activity: Not on file   Other Topics Concern  . Not on file   Social History Narrative  . No narrative on file  ROS: +dizziness, +vertigo, +ha, NO BLURRED  VISION Physical exam:  Blood pressure 125/66, pulse 98, temperature 98.4 F (36.9 C), resp. rate 16, height 5\' 2"  (1.575 m), weight 196 lb (88.9 kg), last menstrual period 01/13/2016. General: alert and no distress HEENT: Normocephalic, Atraumatic Resp:reg and non-labored, no wheezes, no rales or rhonci Neuro: +Rhomberg, very unsteady with testing.  Abdomen: Gravid Extremities: No evidence of DVT seen on physical exam. No lower extremity edema.  Discharge Instructions:Meclinzine and Phenergan RX Activity: Advance as tolerated. Rest, Take Meclizine and Phenergan and push po fluids. If worsening or cannot keep anything down, needs to be seen or fu here.  Diet: Regular Medications:  Outpatient follow up: 1 week Postpartum contraception: BTL  Discharged Condition: Stable, NST reactive with 2 accels 15 x 15 BPM.   Discharged to: Home, Pt lives very close by and feels she can drive self. Pt has no one to drive her home so feel pt can safely make it.    Sharee Pimplearon W Terrick Allred, CNM 11/26/2016

## 2016-11-28 ENCOUNTER — Emergency Department
Admission: EM | Admit: 2016-11-28 | Discharge: 2016-11-28 | Disposition: A | Discharge status: Home / Self Care | Payer: Medicaid Other | Attending: Emergency Medicine | Admitting: Emergency Medicine

## 2016-11-28 ENCOUNTER — Emergency Department
Admission: EM | Admit: 2016-11-28 | Discharge: 2016-11-28 | Disposition: A | Discharge status: Home / Self Care | Payer: Medicaid Other | Source: Home / Self Care

## 2016-11-28 ENCOUNTER — Encounter: Payer: Self-pay | Admitting: Emergency Medicine

## 2016-11-28 DIAGNOSIS — Z5321 Procedure and treatment not carried out due to patient leaving prior to being seen by health care provider: Secondary | ICD-10-CM | POA: Insufficient documentation

## 2016-11-28 DIAGNOSIS — Z87891 Personal history of nicotine dependence: Secondary | ICD-10-CM | POA: Insufficient documentation

## 2016-11-28 DIAGNOSIS — O9952 Diseases of the respiratory system complicating childbirth: Secondary | ICD-10-CM | POA: Insufficient documentation

## 2016-11-28 DIAGNOSIS — J45909 Unspecified asthma, uncomplicated: Secondary | ICD-10-CM | POA: Insufficient documentation

## 2016-11-28 DIAGNOSIS — R05 Cough: Secondary | ICD-10-CM | POA: Insufficient documentation

## 2016-11-28 DIAGNOSIS — R062 Wheezing: Secondary | ICD-10-CM | POA: Insufficient documentation

## 2016-11-28 DIAGNOSIS — Z79899 Other long term (current) drug therapy: Secondary | ICD-10-CM | POA: Insufficient documentation

## 2016-11-28 DIAGNOSIS — Z3A35 35 weeks gestation of pregnancy: Secondary | ICD-10-CM | POA: Insufficient documentation

## 2016-11-28 DIAGNOSIS — R059 Cough, unspecified: Secondary | ICD-10-CM

## 2016-11-28 MED ORDER — HYDROCOD POLST-CPM POLST ER 10-8 MG/5ML PO SUER: 5.0000 mL | Freq: Two times a day (BID) | ORAL | 0 refills | Status: DC | PRN

## 2016-11-28 NOTE — ED Provider Notes (Signed)
[facility]-EMERGENCY DEPARTMENT Provider Note   CSN: [id number] Arrival date & time: 11/28/16  1553     History   Chief Complaint Chief Complaint  Patient presents with  . Cough  . Wheezing    HPI Brandi Fleming is a 31 y.o. female presents to the emergency department for evaluation of cough. Patient states that she has a history of asthma, has been admitted to the hospital 3 times in the last 3 months for wheezing and shortness of breath. She states that she has been taking multiple inhalers daily and currently does not feel short of breath. Also states she does not feel as if she is wheezing. Patient's chief complaint today is her nagging dry cough that has been persistent. She came to the emergency department last night for evaluation due to the wait left and returned today. Patient states her cough is moderate to severe. No chest pain or shortness of breath. She does not have any medication, did get relief from her previous discharge at the end of April with Tussionex. She denies any vaginal discharge or bleeding. Her baby is very active and denies any decrease in fetal movement. She denies any headaches, fevers.   HPI  Past Medical History:  Diagnosis Date  . Asthma   . Bipolar 1 disorder [facility])     Patient Active Problem List   Diagnosis Date Noted  . Pregnancy 11/26/2016  . Shortness of breath in antepartum period 10/22/2016  . Asthma affecting pregnancy, antepartum 09/26/2016  . Bipolar 1 disorder [facility])     Past Surgical History:  Procedure Laterality Date  . WISDOM TOOTH EXTRACTION      OB History    Gravida Para Term Preterm AB Living   2 1       1    SAB TAB Ectopic Multiple Live Births                  Obstetric Comments   History of chronic asthma       Home Medications    Prior to Admission medications   Medication Sig Start Date End Date Taking? Authorizing Provider  albuterol (PROVENTIL) (5 MG/ML) 0.5% nebulizer solution Take 2.5 mg by nebulization  every 6 (six) hours as needed for wheezing or shortness of breath.    [provider]  arformoterol (BROVANA) 15 MCG/2ML NEBU Take 15 mcg by nebulization 2 (two) times daily.    [provider]  chlorpheniramine-HYDROcodone (TUSSIONEX PENNKINETIC ER) 10-8 MG/5ML SUER Take 5 mLs by mouth every 12 (twelve) hours as needed for cough. 11/28/16   Evon Slack, PA-C  meclizine (ANTIVERT) 25 MG tablet Take 1 tablet (25 mg total) by mouth 3 (three) times daily. 11/26/16   Sharee Pimple, CNM  Prenatal Vit-Fe Fumarate-FA (PRENATAL PO) Take 1 tablet by mouth daily.    [provider]  promethazine (PHENERGAN) 25 MG tablet Take 1 tablet (25 mg total) by mouth every 6 (six) hours as needed for nausea or vomiting. 11/26/16   Sharee Pimple, CNM  tiotropium (SPIRIVA) 18 MCG inhalation capsule Place 18 mcg into inhaler and inhale daily.    [provider]    Family History No family history on file.  Social History Social History  Substance Use Topics  . Smoking status: Former Games developer  . Smokeless tobacco: Never Used  . Alcohol use No     Allergies   Vicodin [hydrocodone-acetaminophen]   Review of Systems Review of Systems  Constitutional: Negative for activity change,  chills, fatigue and fever.  HENT: Negative for congestion, sinus pressure and sore throat.   Eyes: Negative for visual disturbance.  Respiratory: Positive for cough and wheezing. Negative for chest tightness and shortness of breath.   Cardiovascular: Negative for chest pain and leg swelling.  Gastrointestinal: Negative for abdominal pain, diarrhea, nausea and vomiting.  Genitourinary: Negative for dysuria, flank pain, frequency, pelvic pain, vaginal bleeding and vaginal discharge.  Musculoskeletal: Negative for arthralgias and gait problem.  Skin: Negative for rash.  Neurological: Negative for weakness, numbness and headaches.  Hematological: Negative for adenopathy.  Psychiatric/Behavioral:  Negative for agitation, behavioral problems and confusion.     Physical Exam Updated Vital Signs BP 116/85 (BP Location: Left Arm)   Pulse (!) 116   Temp 98.4 F (36.9 C) (Oral)   Resp 16   Ht 5\' 2"  (1.575 m)   Wt 196 lb (88.9 kg)   LMP 01/13/2016   SpO2 94%   BMI 35.85 kg/m   Physical Exam  Constitutional: She is oriented to person, place, and time. She appears well-developed and well-nourished. No distress.  HENT:  Head: Normocephalic and atraumatic.  Right Ear: External ear normal.  Left Ear: External ear normal.  Mouth/Throat: Oropharynx is clear and moist.  Eyes: EOM are normal. Pupils are equal, round, and reactive to light. Right eye exhibits no discharge. Left eye exhibits no discharge.  Neck: Normal range of motion. Neck supple.  Cardiovascular: Normal rate, regular rhythm and intact distal pulses.   Pulmonary/Chest: Effort normal. No respiratory distress. She has wheezes (minimal faint expiratory wheezing). She has no rales. She exhibits no tenderness.  Abdominal: Soft. She exhibits no distension. There is no tenderness. There is no guarding.  Patient with normal fundal height consistent with being [redacted] weeks pregnant.  Musculoskeletal: Normal range of motion. She exhibits no edema.  Lymphadenopathy:    She has no cervical adenopathy.  Neurological: She is alert and oriented to person, place, and time. She has normal reflexes.  Skin: Skin is warm and dry.  Psychiatric: She has a normal mood and affect. Her behavior is normal. Thought content normal.     ED Treatments / Results  Labs (all labs ordered are listed, but only abnormal results are displayed) Labs Reviewed - No data to display  EKG  EKG Interpretation None       Radiology No results found.  Procedures Procedures (including critical care time)  Medications Ordered in ED Medications - No data to display   Initial Impression / Assessment and Plan / ED Course  I have reviewed the triage  vital signs and the nursing notes.  Pertinent labs & imaging results that were available during my care of the patient were reviewed by me and considered in my medical decision making (see chart for details).     31 year old female with history of asthma on multiple inhalers presents with evaluation of dry cough. No chest pain or shortness of breath. Minimal expiratory wheezing. She is afebrile vital signs are stable.  Has had a few admissions over the past few months, states that she is not nearly as bad as she was prior to admission. She is given a prescription for Tussionex for cough. She'll follow-up with OB in 2-3 days for recheck. She is educated on signs and to return to the ED for. Final diagnoses:  Cough    New Prescriptions New Prescriptions   CHLORPHENIRAMINE-HYDROCODONE (TUSSIONEX PENNKINETIC ER) 10-8 MG/5ML SUER    Take 5 mLs by mouth every 12 (  twelve) hours as needed for cough.     Evon Slack, PA-C 11/28/16 1752    Arnaldo Natal, MD 11/30/16 [phone]

## 2016-11-28 NOTE — ED Notes (Signed)
Pt is [redacted] weeks pregnant, c/o cough and chest congestion for the past week. Dry cough. Denies fevers.

## 2016-11-28 NOTE — Discharge Instructions (Signed)
Please use medication as needed for severe cough. Return to the ER for any fevers, worsening symptoms or changes in her health. Use a cool mist vaporizer to help with cough.

## 2016-11-28 NOTE — ED Triage Notes (Addendum)
Reports coughing since 9 pm.  Reports recently treated for inner ear infection. Lungs with scattered wheezing bilaterally.  Patient reports she has been taking breathing treatments at home without relief.

## 2016-11-28 NOTE — ED Triage Notes (Signed)
C/O cough and wheezing x 1 day.  Patient is [redacted] weeks pregnant.

## 2016-12-04 ENCOUNTER — Observation Stay
Admission: EM | Admit: 2016-12-04 | Discharge: 2016-12-04 | Disposition: A | Discharge status: Home / Self Care | Payer: Medicaid Other | Attending: Obstetrics and Gynecology | Admitting: Obstetrics and Gynecology

## 2016-12-04 DIAGNOSIS — Z3A34 34 weeks gestation of pregnancy: Secondary | ICD-10-CM | POA: Diagnosis not present

## 2016-12-04 DIAGNOSIS — O3443 Maternal care for other abnormalities of cervix, third trimester: Secondary | ICD-10-CM | POA: Diagnosis not present

## 2016-12-04 DIAGNOSIS — O99343 Other mental disorders complicating pregnancy, third trimester: Secondary | ICD-10-CM | POA: Diagnosis not present

## 2016-12-04 DIAGNOSIS — Z87891 Personal history of nicotine dependence: Secondary | ICD-10-CM | POA: Diagnosis not present

## 2016-12-04 DIAGNOSIS — O99513 Diseases of the respiratory system complicating pregnancy, third trimester: Secondary | ICD-10-CM | POA: Diagnosis not present

## 2016-12-04 DIAGNOSIS — Z885 Allergy status to narcotic agent status: Secondary | ICD-10-CM | POA: Insufficient documentation

## 2016-12-04 DIAGNOSIS — Z79899 Other long term (current) drug therapy: Secondary | ICD-10-CM | POA: Insufficient documentation

## 2016-12-04 DIAGNOSIS — O9989 Other specified diseases and conditions complicating pregnancy, childbirth and the puerperium: Secondary | ICD-10-CM | POA: Diagnosis present

## 2016-12-04 DIAGNOSIS — J45909 Unspecified asthma, uncomplicated: Secondary | ICD-10-CM | POA: Diagnosis not present

## 2016-12-04 DIAGNOSIS — N841 Polyp of cervix uteri: Secondary | ICD-10-CM | POA: Diagnosis not present

## 2016-12-04 DIAGNOSIS — F319 Bipolar disorder, unspecified: Secondary | ICD-10-CM | POA: Diagnosis not present

## 2016-12-04 DIAGNOSIS — Z3490 Encounter for supervision of normal pregnancy, unspecified, unspecified trimester: Secondary | ICD-10-CM

## 2016-12-04 LAB — ROM PLUS (ARMC ONLY): Rom Plus: NEGATIVE

## 2016-12-04 NOTE — Progress Notes (Signed)
Patient ID: Brandi Fleming, female   DOB: 09/17/1985, 31 y.o.   MRN: [id number]  Brandi Fleming is a 31 y.o. female. She is at 8052w3d gestation. Patient's last menstrual period was 01/13/2016.c/o LOF tonight when getting up from urination .  No vaginal bleeding  Estimated Date of Delivery: 01/12/17  [facility] Chief complaint:   S: Resting comfortably. no CTX, no VB.+,  Active fetal movement.   Maternal Medical History:   Past Medical History:  Diagnosis Date  . Asthma   . Bipolar 1 disorder [facility](HCC)     Past Surgical History:  Procedure Laterality Date  . WISDOM TOOTH EXTRACTION      Allergies  Allergen Reactions  . Vicodin [Hydrocodone-Acetaminophen] Itching    Prior to Admission medications   Medication Sig Start Date End Date Taking? Authorizing Provider  arformoterol (BROVANA) 15 MCG/2ML NEBU Take 15 mcg by nebulization 2 (two) times daily.   Yes [provider]  Prenatal Vit-Fe Fumarate-FA (PRENATAL PO) Take 1 tablet by mouth daily.   Yes [provider]  tiotropium (SPIRIVA) 18 MCG inhalation capsule Place 18 mcg into inhaler and inhale daily.   Yes [provider]  chlorpheniramine-HYDROcodone (TUSSIONEX PENNKINETIC ER) 10-8 MG/5ML SUER Take 5 mLs by mouth every 12 (twelve) hours as needed for cough. 11/28/16   Evon SlackGaines, Thomas C, PA-C  meclizine (ANTIVERT) 25 MG tablet Take 1 tablet (25 mg total) by mouth 3 (three) times daily. 11/26/16   Sharee PimpleJones, Caron W, CNM  promethazine (PHENERGAN) 25 MG tablet Take 1 tablet (25 mg total) by mouth every 6 (six) hours as needed for nausea or vomiting. 11/26/16   Sharee PimpleJones, Caron W, CNM     Social History: She  reports that she has quit smoking. She has never used smokeless tobacco. She reports that she has current or past drug history. She reports that she does not drink alcohol.  Family History: family history is not on file.  no history of gyn cancers  Review of Systems: A full review  of systems was performed and negative except as noted in the HPI.   PUlm + severe RAD  GYn : no pelvic infection  CV : no CP  pyschiatric : + anxiety  O:140/66 p=104  LMP 01/13/2016  Results for orders placed or performed during the hospital encounter of 12/04/16 (from the past 48 hour(s))  ROM Plus ([facility] only)   Collection Time: 12/04/16  8:30 PM  Result Value Ref Range   Rom Plus NEGATIVE      Constitutional: NAD, AAOx3  HE/ENT: extraocular movements grossly intact, moist mucous membranes CV: RRR PULM: nl respiratory effort, CTABL     Abd: gravid, non-tender, non-distended, soft      Ext: Non-tender, Nonedmeatous   Psych: mood appropriate, speech normal PelvicSSE : no pooling , no valsalva , neg nitrazine , neg fern  + BV on wet mount  1.5x1.5 cervical polyp seen at os , CX closed , 40% / OOP VTX  NST:  Baseline: 150  Variability: moderate Accelerations present x >2 Decelerations absent Time 20mins     A/P: 31 y.o. 1652w3d here forLOF with no evidence of ROM    Cx polyp friable and bleeds easily   Labor: not present.   Fetal Wellbeing: Reassuring Cat 1 tracing.  Reactive NST   D/c home stable, precautions reviewed, follow-up as scheduled.   ----- Brandi Austinhomas J Schermerhorn  MD Attending Obstetrician and Gynecologist [facility], Department of OB/GYN [facility]  [facility]

## 2016-12-04 NOTE — Discharge Instructions (Signed)
Discharge instructions given, all questions answered.

## 2016-12-04 NOTE — Discharge Summary (Signed)
Schermerhorn, Ihor Austin, MD  Obstetrics    [] Hide copied text [] Hover for attribution information Patient ID: Brandi Fleming, female   DOB: [birth year], 31 y.o.   MRN: [id number]  Brandi Fleming is a 31 y.o. female. She is at [redacted]w[redacted]d gestation. Patient's last menstrual period was 01/13/2016.c/o LOF tonight when getting up from urination .  No vaginal bleeding  Estimated Date of Delivery: 01/12/17  Prenatal care site: [facility] Chief complaint:   S: Resting comfortably. noCTX, no VB.+, Active fetal movement.   Maternal Medical History:       Past Medical History:  Diagnosis Date  . Asthma   . Bipolar 1 disorder [facility])          Past Surgical History:  Procedure Laterality Date  . WISDOM TOOTH EXTRACTION      Allergies  Allergen Reactions  . Vicodin [Hydrocodone-Acetaminophen] Itching           Prior to Admission medications   Medication Sig Start Date End Date Taking? Authorizing Provider  arformoterol (BROVANA) 15 MCG/2ML NEBU Take 15 mcg by nebulization 2 (two) times daily.   Yes [provider]  Prenatal Vit-Fe Fumarate-FA (PRENATAL PO) Take 1 tablet by mouth daily.   Yes [provider]  tiotropium (SPIRIVA) 18 MCG inhalation capsule Place 18 mcg into inhaler and inhale daily.   Yes [provider]  chlorpheniramine-HYDROcodone (TUSSIONEX PENNKINETIC ER) 10-8 MG/5ML SUER Take 5 mLs by mouth every 12 (twelve) hours as needed for cough. 11/28/16   Evon Slack, PA-C  meclizine (ANTIVERT) 25 MG tablet Take 1 tablet (25 mg total) by mouth 3 (three) times daily. 11/26/16   Sharee Pimple, CNM  promethazine (PHENERGAN) 25 MG tablet Take 1 tablet (25 mg total) by mouth every 6 (six) hours as needed for nausea or vomiting. 11/26/16   Sharee Pimple, CNM     Social History: She  reports that she has quit smoking. She has never used smokeless tobacco. She reports that she has current or past drug history. She  reports that she does not drink alcohol.  Family History: family history is not on file.  no history of gyn cancers  Review of Systems: A full review of systems was performed and negative except as noted in the HPI.   PUlm + severe RAD  GYn : no pelvic infection  CV : no CP  pyschiatric : + anxiety  O:140/66 p=104  LMP 01/13/2016       Results for orders placed or performed during the hospital encounter of 12/04/16 (from the past 48 hour(s))  ROM Plus ([facility] only)   Collection Time: 12/04/16  8:30 PM  Result Value Ref Range   Rom Plus NEGATIVE    Constitutional: NAD, AAOx3  HE/ENT: extraocular movements grossly intact, moist mucous membranes CV: RRR PULM: nl respiratory effort, CTABL                                         Abd: gravid, non-tender, non-distended, soft                                                  Ext: Non-tender, Nonedmeatous  Psych: mood appropriate, speech normal PelvicSSE : no pooling , no valsalva , neg nitrazine , neg fern  + BV on wet mount  1.5x1.5 cervical polyp seen at os , CX closed , 40% / OOP VTX  NST:  Baseline: 150  Variability: moderate Accelerations present x >2 Decelerations absent Time 20mins     A/P: 31 y.o. 326w3d here forLOF with no evidence of ROM    Cx polyp friable and bleeds easily   Labor: not present.   Fetal Wellbeing: Reassuring Cat 1 tracing.  Reactive NST   D/c home stable, precautions reviewed, follow-up as scheduled.  flagyl 500 mg bid x 7 days  ----- Suzy Bouchardhomas J Schermerhorn  MD Attending Obstetrician and Gynecologist [facility], Department of OB/GYN [facility]     Electronically sign

## 2016-12-17 LAB — OB RESULTS CONSOLE GC/CHLAMYDIA
CHLAMYDIA, DNA PROBE: NEGATIVE
Gonorrhea: NEGATIVE

## 2016-12-17 LAB — OB RESULTS CONSOLE RPR: RPR: NONREACTIVE

## 2016-12-17 LAB — OB RESULTS CONSOLE GBS: GBS: NEGATIVE

## 2016-12-25 ENCOUNTER — Observation Stay
Admission: EM | Admit: 2016-12-25 | Discharge: 2016-12-26 | Disposition: A | Discharge status: Home / Self Care | Payer: Medicaid Other | Source: Home / Self Care | Admitting: Obstetrics and Gynecology

## 2016-12-25 DIAGNOSIS — Z885 Allergy status to narcotic agent status: Secondary | ICD-10-CM | POA: Insufficient documentation

## 2016-12-25 DIAGNOSIS — Z3490 Encounter for supervision of normal pregnancy, unspecified, unspecified trimester: Secondary | ICD-10-CM

## 2016-12-25 DIAGNOSIS — Z87891 Personal history of nicotine dependence: Secondary | ICD-10-CM

## 2016-12-25 DIAGNOSIS — F319 Bipolar disorder, unspecified: Secondary | ICD-10-CM | POA: Insufficient documentation

## 2016-12-25 DIAGNOSIS — O99513 Diseases of the respiratory system complicating pregnancy, third trimester: Secondary | ICD-10-CM

## 2016-12-25 DIAGNOSIS — J45909 Unspecified asthma, uncomplicated: Secondary | ICD-10-CM

## 2016-12-25 DIAGNOSIS — O99343 Other mental disorders complicating pregnancy, third trimester: Secondary | ICD-10-CM

## 2016-12-25 DIAGNOSIS — O471 False labor at or after 37 completed weeks of gestation: Secondary | ICD-10-CM

## 2016-12-25 DIAGNOSIS — Z3A37 37 weeks gestation of pregnancy: Secondary | ICD-10-CM

## 2016-12-25 DIAGNOSIS — O479 False labor, unspecified: Secondary | ICD-10-CM | POA: Diagnosis present

## 2016-12-25 NOTE — Progress Notes (Addendum)
Patient ID: Denice BorsJosie L Josten, female   DOB: 01/24/1986, 31 y.o.   MRN: [id number]  Denice BorsJosie L Ossa is a 31 y.o. female. She is at 4571w3d gestation. Patient's last menstrual period was 01/13/2016. Estimated Date of Delivery: 01/12/17  [facility]  Chief complaint:uterine ctx     S: Resting comfortably. no CTX, no VB.no LOF,  Active fetal movement. No h/a  Maternal Medical History:   Past Medical History:  Diagnosis Date  . Asthma   . Bipolar 1 disorder [facility](HCC)     Past Surgical History:  Procedure Laterality Date  . WISDOM TOOTH EXTRACTION      Allergies  Allergen Reactions  . Vicodin [Hydrocodone-Acetaminophen] Itching    Prior to Admission medications   Medication Sig Start Date End Date Taking? Authorizing Provider  arformoterol (BROVANA) 15 MCG/2ML NEBU Take 15 mcg by nebulization 2 (two) times daily.   Yes [provider]  chlorpheniramine-HYDROcodone (TUSSIONEX PENNKINETIC ER) 10-8 MG/5ML SUER Take 5 mLs by mouth every 12 (twelve) hours as needed for cough. 11/28/16  Yes Evon SlackGaines, Thomas C, PA-C  Prenatal Vit-Fe Fumarate-FA (PRENATAL PO) Take 1 tablet by mouth daily.   Yes [provider]  tiotropium (SPIRIVA) 18 MCG inhalation capsule Place 18 mcg into inhaler and inhale daily.   Yes [provider]  meclizine (ANTIVERT) 25 MG tablet Take 1 tablet (25 mg total) by mouth 3 (three) times daily. Patient not taking: Reported on 12/25/2016 11/26/16   Sharee PimpleJones, Caron W, CNM  promethazine (PHENERGAN) 25 MG tablet Take 1 tablet (25 mg total) by mouth every 6 (six) hours as needed for nausea or vomiting. Patient not taking: Reported on 12/25/2016 11/26/16   Sharee PimpleJones, Caron W, CNM     Social History: She  reports that she has quit smoking. She has never used smokeless tobacco. She reports that she has current or past drug history. She reports that she does not drink alcohol.  Family History: family history is not on file.  no history of gyn  cancers  Review of Systems: A full review of systems was performed and negative except as noted in the HPI.    PUlm: + asthma CV : no Chest pain  Psych : + bipolar Gyn no irregular bleeding  M/S : no weakness O:  BP 131/82   Pulse 75   Temp 98 F (36.7 C) (Oral)   Resp 16   Ht 5\' 2"  (1.575 m)   Wt 91.6 kg (202 lb)   LMP 01/13/2016   BMI 36.95 kg/m  Results for orders placed or performed during the hospital encounter of 12/25/16 (from the past 48 hour(s))  Protein / creatinine ratio, urine   Collection Time: 12/25/16 11:14 PM  Result Value Ref Range   Creatinine, Urine 43 mg/dL   Total Protein, Urine 10 mg/dL   Protein Creatinine Ratio 0.23 (H) 0.00 - 0.15 mg/mg[Cre]  CBC   Collection Time: 12/26/16 12:52 AM  Result Value Ref Range   WBC 10.0 3.6 - 11.0 K/uL   RBC 3.60 (L) 3.80 - 5.20 MIL/uL   Hemoglobin 11.0 (L) 12.0 - 16.0 g/dL   HCT 14.731.0 (L) 82.935.0 - 56.247.0 %   MCV 86.1 80.0 - 100.0 fL   MCH 30.5 26.0 - 34.0 pg   MCHC 35.4 32.0 - 36.0 g/dL   RDW 13.014.2 86.511.5 - 78.414.5 %   Platelets 208 150 - 440 K/uL   Lungs CTA  CV RRR abd soft Gravid Pelvic 1cm by RN  NST: reactive EFM 140-150 + accels , no decels  Irregular ctx    A: 31 y.o. [redacted]w[redacted]d here for  UTX cTX  Elevated BP , P/Cr ratio  Neg, but BP rising will observe overnight to monitor BP .    12/26/16 0745 : BP normalized overnight  CBC nl  D/c home  precautions given  ----- Jennell Corner, MD Attending Obstetrician and Gynecologist [facility], Department of OB/GYN [facility]

## 2016-12-25 NOTE — OB Triage Note (Signed)
Patient arrived in triage with c/o contractions since approx 1900, unable to note how far apart. Notes generalized pain to entire abdomen with intermittent "sharp" pains to perineum.  Reports some bleeding noted when wiping, "pink tinged".  Denies leaking of fluid.  Reports last fetal movement while in room. Abdomen palpated soft. EFM explained and applied. Discussed plan of care. Patient verbalized understanding.

## 2016-12-26 ENCOUNTER — Inpatient Hospital Stay
Admit: 2016-12-26 | Discharge: 2016-12-29 | DRG: 774 | Disposition: A | Discharge status: Home / Self Care | Payer: Medicaid Other | Source: Intra-hospital | Attending: Obstetrics and Gynecology | Admitting: Obstetrics and Gynecology

## 2016-12-26 DIAGNOSIS — O479 False labor, unspecified: Secondary | ICD-10-CM | POA: Diagnosis present

## 2016-12-26 DIAGNOSIS — O99344 Other mental disorders complicating childbirth: Secondary | ICD-10-CM | POA: Diagnosis present

## 2016-12-26 DIAGNOSIS — Z87891 Personal history of nicotine dependence: Secondary | ICD-10-CM | POA: Diagnosis not present

## 2016-12-26 DIAGNOSIS — O135 Gestational [pregnancy-induced] hypertension without significant proteinuria, complicating the puerperium: Secondary | ICD-10-CM | POA: Diagnosis not present

## 2016-12-26 DIAGNOSIS — J45909 Unspecified asthma, uncomplicated: Secondary | ICD-10-CM | POA: Diagnosis present

## 2016-12-26 DIAGNOSIS — F319 Bipolar disorder, unspecified: Secondary | ICD-10-CM | POA: Diagnosis present

## 2016-12-26 DIAGNOSIS — O0993 Supervision of high risk pregnancy, unspecified, third trimester: Secondary | ICD-10-CM

## 2016-12-26 DIAGNOSIS — O9081 Anemia of the puerperium: Secondary | ICD-10-CM | POA: Diagnosis present

## 2016-12-26 DIAGNOSIS — Z3A37 37 weeks gestation of pregnancy: Secondary | ICD-10-CM | POA: Diagnosis not present

## 2016-12-26 DIAGNOSIS — O9952 Diseases of the respiratory system complicating childbirth: Principal | ICD-10-CM | POA: Diagnosis present

## 2016-12-26 LAB — CBC
HEMATOCRIT: 31 % — AB (ref 35.0–47.0)
HEMATOCRIT: 32.6 % — AB (ref 35.0–47.0)
HEMOGLOBIN: 11 g/dL — AB (ref 12.0–16.0)
HEMOGLOBIN: 11.6 g/dL — AB (ref 12.0–16.0)
MCH: 30.5 pg (ref 26.0–34.0)
MCH: 30.9 pg (ref 26.0–34.0)
MCHC: 35.4 g/dL (ref 32.0–36.0)
MCHC: 35.6 g/dL (ref 32.0–36.0)
MCV: 86.1 fL (ref 80.0–100.0)
MCV: 86.7 fL (ref 80.0–100.0)
PLATELETS: 208 10*3/uL (ref 150–440)
Platelets: 226 10*3/uL (ref 150–440)
RBC: 3.6 MIL/uL — AB (ref 3.80–5.20)
RBC: 3.76 MIL/uL — ABNORMAL LOW (ref 3.80–5.20)
RDW: 14.2 % (ref 11.5–14.5)
RDW: 14 % (ref 11.5–14.5)
WBC: 10 10*3/uL (ref 3.6–11.0)
WBC: 11 10*3/uL (ref 3.6–11.0)

## 2016-12-26 LAB — PROTEIN / CREATININE RATIO, URINE
Creatinine, Urine: 43 mg/dL
Protein Creatinine Ratio: 0.23 mg/mg{Cre} — ABNORMAL HIGH (ref 0.00–0.15)
Total Protein, Urine: 10 mg/dL

## 2016-12-26 MED ORDER — OXYTOCIN 40 UNITS IN LACTATED RINGERS INFUSION - SIMPLE MED
2.5000 [IU]/h | INTRAVENOUS | Status: DC
  Administered 2016-12-27: 2.5 [IU]/h via INTRAVENOUS

## 2016-12-26 MED ORDER — OXYTOCIN 40 UNITS IN LACTATED RINGERS INFUSION - SIMPLE MED: 1.0000 m[IU]/min | INTRAVENOUS | Status: DC

## 2016-12-26 MED ORDER — LACTATED RINGERS IV SOLN
INTRAVENOUS | Status: DC
  Administered 2016-12-26 – 2016-12-27 (×4): via INTRAVENOUS

## 2016-12-26 MED ORDER — SOD CITRATE-CITRIC ACID 500-334 MG/5ML PO SOLN
30.0000 mL | ORAL | Status: DC | PRN
Start: 2016-12-26 — End: 2016-12-28
  Filled 2016-12-26: qty 30

## 2016-12-26 MED ORDER — OXYTOCIN BOLUS FROM INFUSION
500.0000 mL | Freq: Once | INTRAVENOUS | Status: AC
  Administered 2016-12-27: 500 mL via INTRAVENOUS

## 2016-12-26 MED ORDER — TERBUTALINE SULFATE 1 MG/ML IJ SOLN: 0.2500 mg | Freq: Once | INTRAMUSCULAR | Status: DC | PRN

## 2016-12-26 MED ORDER — MISOPROSTOL 25 MCG QUARTER TABLET
25.0000 ug | ORAL_TABLET | ORAL | Status: DC | PRN
  Administered 2016-12-26 – 2016-12-27 (×2): 25 ug via VAGINAL
  Filled 2016-12-26 (×3): qty 1

## 2016-12-26 MED ORDER — ONDANSETRON HCL 4 MG/2ML IJ SOLN
4.0000 mg | Freq: Four times a day (QID) | INTRAMUSCULAR | Status: DC | PRN
  Administered 2016-12-27: 4 mg via INTRAVENOUS
  Filled 2016-12-26: qty 2

## 2016-12-26 MED ORDER — LACTATED RINGERS IV SOLN: 500.0000 mL | INTRAVENOUS | Status: DC | PRN

## 2016-12-26 MED ORDER — LIDOCAINE HCL (PF) 1 % IJ SOLN: 30.0000 mL | INTRAMUSCULAR | Status: DC | PRN

## 2016-12-26 NOTE — OB Triage Note (Signed)
Scheduled IOL for asthma

## 2016-12-26 NOTE — Discharge Instructions (Signed)
Please keep your next scheduled appointment.  If you have any questions or concerns please call your oncall provider.  You may also call the [facility] nurse's desk at [phone].  If you have urgent concerns please go to the nearest emergency department for evaluation.

## 2016-12-27 ENCOUNTER — Inpatient Hospital Stay: Payer: Medicaid Other | Admitting: Anesthesiology

## 2016-12-27 LAB — URINE DRUG SCREEN, QUALITATIVE (ARMC ONLY)
AMPHETAMINES, UR SCREEN: NOT DETECTED
BARBITURATES, UR SCREEN: NOT DETECTED
Benzodiazepine, Ur Scrn: NOT DETECTED
COCAINE METABOLITE, UR ~~LOC~~: NOT DETECTED
Cannabinoid 50 Ng, Ur ~~LOC~~: NOT DETECTED
MDMA (Ecstasy)Ur Screen: NOT DETECTED
Methadone Scn, Ur: NOT DETECTED
OPIATE, UR SCREEN: NOT DETECTED
Phencyclidine (PCP) Ur S: NOT DETECTED
Tricyclic, Ur Screen: NOT DETECTED

## 2016-12-27 LAB — PROTEIN / CREATININE RATIO, URINE: Total Protein, Urine: 190 mg/dL

## 2016-12-27 MED ORDER — HYDRALAZINE HCL 20 MG/ML IJ SOLN
5.0000 mg | INTRAMUSCULAR | Status: AC | PRN
  Administered 2016-12-27: 5 mg via INTRAVENOUS
  Administered 2016-12-27: 10 mg via INTRAVENOUS
  Filled 2016-12-27 (×2): qty 1

## 2016-12-27 MED ORDER — DIPHENHYDRAMINE HCL 50 MG/ML IJ SOLN: 12.5000 mg | INTRAMUSCULAR | Status: DC | PRN

## 2016-12-27 MED ORDER — EPHEDRINE 5 MG/ML INJ
10.0000 mg | INTRAVENOUS | Status: DC | PRN
  Filled 2016-12-27: qty 2

## 2016-12-27 MED ORDER — FENTANYL 2.5 MCG/ML W/ROPIVACAINE 0.2% IN NS 100 ML EPIDURAL INFUSION (ARMC-ANES)
EPIDURAL | Status: AC
  Filled 2016-12-27: qty 100

## 2016-12-27 MED ORDER — OXYTOCIN 40 UNITS IN LACTATED RINGERS INFUSION - SIMPLE MED
1.0000 m[IU]/min | INTRAVENOUS | Status: DC
  Administered 2016-12-27: 1 m[IU]/min via INTRAVENOUS

## 2016-12-27 MED ORDER — BUPIVACAINE HCL (PF) 0.25 % IJ SOLN
INTRAMUSCULAR | Status: DC | PRN
  Administered 2016-12-27: 5 mL via EPIDURAL

## 2016-12-27 MED ORDER — TERBUTALINE SULFATE 1 MG/ML IJ SOLN: 0.2500 mg | Freq: Once | INTRAMUSCULAR | Status: DC | PRN

## 2016-12-27 MED ORDER — PHENYLEPHRINE 40 MCG/ML (10ML) SYRINGE FOR IV PUSH (FOR BLOOD PRESSURE SUPPORT)
80.0000 ug | PREFILLED_SYRINGE | INTRAVENOUS | Status: DC | PRN
  Filled 2016-12-27: qty 5

## 2016-12-27 MED ORDER — BUDESONIDE 0.5 MG/2ML IN SUSP
0.5000 mg | Freq: Every day | RESPIRATORY_TRACT | Status: DC
  Administered 2016-12-27 – 2016-12-29 (×3): 0.5 mg via RESPIRATORY_TRACT
  Filled 2016-12-27 (×4): qty 2

## 2016-12-27 MED ORDER — FENTANYL 2.5 MCG/ML W/ROPIVACAINE 0.15% IN NS 100 ML EPIDURAL (ARMC)
12.0000 mL/h | EPIDURAL | Status: DC
  Administered 2016-12-27: 12 mL/h via EPIDURAL
  Administered 2016-12-27: 10 mL/h via EPIDURAL

## 2016-12-27 MED ORDER — LACTATED RINGERS IV SOLN: 500.0000 mL | Freq: Once | INTRAVENOUS | Status: DC

## 2016-12-27 MED ORDER — HYDRALAZINE HCL 20 MG/ML IJ SOLN
INTRAMUSCULAR | Status: AC
  Filled 2016-12-27: qty 1

## 2016-12-27 MED ORDER — ARFORMOTEROL TARTRATE 15 MCG/2ML IN NEBU
15.0000 ug | INHALATION_SOLUTION | Freq: Every day | RESPIRATORY_TRACT | Status: DC
  Administered 2016-12-27 – 2016-12-29 (×2): 15 ug via RESPIRATORY_TRACT
  Filled 2016-12-27 (×4): qty 2

## 2016-12-27 MED ORDER — LABETALOL HCL 5 MG/ML IV SOLN: 20.0000 mg | INTRAVENOUS | Status: DC | PRN

## 2016-12-27 MED ORDER — MISOPROSTOL 25 MCG QUARTER TABLET
25.0000 ug | ORAL_TABLET | ORAL | Status: DC | PRN
  Administered 2016-12-27: 25 ug via BUCCAL

## 2016-12-27 MED ORDER — OXYTOCIN 40 UNITS IN LACTATED RINGERS INFUSION - SIMPLE MED
INTRAVENOUS | Status: AC
  Filled 2016-12-27: qty 1000

## 2016-12-27 MED ORDER — BUTORPHANOL TARTRATE 2 MG/ML IJ SOLN
INTRAMUSCULAR | Status: AC
  Administered 2016-12-27: 2 mg
  Filled 2016-12-27: qty 2

## 2016-12-27 MED ORDER — TIOTROPIUM BROMIDE MONOHYDRATE 18 MCG IN CAPS
18.0000 ug | ORAL_CAPSULE | Freq: Every day | RESPIRATORY_TRACT | Status: DC
  Administered 2016-12-28: 18 ug via RESPIRATORY_TRACT
  Filled 2016-12-27 (×2): qty 5

## 2016-12-27 NOTE — Progress Notes (Signed)
S: Resting comfortably and sleeping through 3rd Cytotec. + CTX, no LOF, VB. O: Vitals:   12/27/16 0718 12/27/16 0752 12/27/16 0922 12/27/16 1130  BP: 127/70 134/85 (!) 148/95 123/74  Pulse: 67 70 75 78  Resp: 19 17    Temp:  98.1 F (36.7 C)    TempSrc:  Oral    Weight: 202 lb (91.6 kg)     Height: 5\' 2"  (1.575 m)        Gen: NAD, AAOx3      Abd: FNTTP      Ext: Non-tender, Nonedmeatous    FHT:+ mod var + accelerations no decelerations TOCO: Q 3-10  min SVE: 2.5/60%/vtx-2   A/P:  31 y.o. yo G2P1001 at 764w5d for IOL due to severe asthma.   Labor: induction   FWB: Reassuring Cat 1 tracing. EFW &#8oz  GBS: Neg  Elevated BP's per nursing as pt was on the phone bent with her arm and shortly after, taken with highest being 148/95.Sharee Pimple.   Caron W Jones 12:38 PM

## 2016-12-27 NOTE — Anesthesia Procedure Notes (Signed)
Epidural Patient location during procedure: OB  Staffing Anesthesiologist: Arick Mareno Performed: anesthesiologist   Preanesthetic Checklist Completed: patient identified, site marked, surgical consent, pre-op evaluation, timeout performed, IV checked, risks and benefits discussed and monitors and equipment checked  Epidural Patient position: sitting Prep: Betadine Patient monitoring: heart rate, continuous pulse ox and blood pressure Approach: midline Location: L4-L5 Injection technique: LOR saline  Needle:  Needle type: Tuohy  Needle gauge: 18 G Needle length: 9 cm and 9 Catheter type: closed end flexible Catheter size: 20 Guage Test dose: negative and 1.5% lidocaine with Epi 1:200 K  Assessment Sensory level: T10 Events: blood not aspirated, injection not painful, no injection resistance, negative IV test and no paresthesia  Additional Notes   Patient tolerated the insertion well without complications.Reason for block:procedure for pain     

## 2016-12-27 NOTE — Anesthesia Preprocedure Evaluation (Signed)
Anesthesia Evaluation  Patient identified by MRN, date of birth, ID band Patient awake    Reviewed: Allergy & Precautions, NPO status , Patient's Chart, lab work & pertinent test results, reviewed documented beta blocker date and time   Airway Mallampati: II  TM Distance: >3 FB     Dental  (+) Chipped   Pulmonary asthma , former smoker,           Cardiovascular      Neuro/Psych PSYCHIATRIC DISORDERS Bipolar Disorder    GI/Hepatic   Endo/Other    Renal/GU      Musculoskeletal   Abdominal   Peds  Hematology   Anesthesia Other Findings   Reproductive/Obstetrics                             Anesthesia Physical Anesthesia Plan  ASA: III  Anesthesia Plan: Epidural   Post-op Pain Management:    Induction:   PONV Risk Score and Plan:   Airway Management Planned:   Additional Equipment:   Intra-op Plan:   Post-operative Plan:   Informed Consent: I have reviewed the patients History and Physical, chart, labs and discussed the procedure including the risks, benefits and alternatives for the proposed anesthesia with the patient or authorized representative who has indicated his/her understanding and acceptance.     Plan Discussed with: CRNA  Anesthesia Plan Comments:         Anesthesia Quick Evaluation

## 2016-12-27 NOTE — Progress Notes (Signed)
S: Resting comfortably without epidural. + CTX, no LOF, VB O: Vitals:   12/27/16 0922 12/27/16 1023 12/27/16 1130 12/27/16 1237  BP: (!) 148/95 134/90 123/74 123/70  Pulse: 75 81 78 75  Resp:   19   Temp:   97.9 F (36.6 C)   TempSrc:   Oral   Weight:      Height:         Gen: NAD, AAOx3      Abd: FNTTP      Ext: Non-tender, Nonedmeatous    FHT: + mod var + accelerations no decelerations TOCO: irreg SVE: 3/50%/vtx-2   A/P:  31 y.o. yo G2P1001 at 2350w5d for severe asthma.   Labor: early   FWB: Reassuring Cat 1 tracing. EFW &#8oz  GBS: neg  AROM at 1353 for mod clear fluid with normal FHT after AROM, IUPC inserted.  Will start Pitocin    Brandi Fleming 1:56 PM

## 2016-12-27 NOTE — Progress Notes (Signed)
S: Resting comfortably with  epidural. + CTX, no LOF, VB O: Vitals:   12/27/16 1623 12/27/16 1627 12/27/16 1642 12/27/16 1658  BP: 134/88 (!) 149/96 (!) 140/98 (!) 153/92  Pulse: 84 92 78 74  Resp:      Temp:      TempSrc:      Weight:      Height:         Gen: NAD, AAOx3      Abd: FNTTP      Ext: Non-tender, Nonedmeatous    FHT:+ mod var + accelerations no decelerations TOCO: Q 2-4 mins  min SVE: 6/100/vtx-2   A/P:  31 y.o. yo G2P1001 at 5935w5d for severe asthma.   Labor: progressing   FWB: Reassuring Cat 1 tracing.   GBS: neg   Antic SVD    Sharee Pimplearon W Libertie Hausler 7:05 PM

## 2016-12-27 NOTE — Progress Notes (Signed)
S: Resting comfortably with epidural. + CTX, + LOF, VB O: Vitals:   12/27/16 1642 12/27/16 1658 12/27/16 1758 12/27/16 1858  BP: (!) 140/98 (!) 153/92 139/90 (!) 141/89  Pulse: 78 74 93 73  Resp:      Temp:      TempSrc:      Weight:      Height:         Gen: NAD, AAOx3      Abd: FNTTP      Ext: Non-tender, Nonedmeatous    FHT: + mod var + accelerations no decelerations TOCO: Q  min SVE: 8/100%/vtx-2   A/P:  31 y.o. yo G2P1001 at 7942w5d for Severe asthma.   Labor: induction   FWB: Reassuring Cat 1 tracing.   GBS: neg        Antic SVD   Sharee Pimplearon W Jones 8:30 PM

## 2016-12-27 NOTE — H&P (Signed)
HISTORY AND PHYSICAL  HISTORY OF PRESENT ILLNESS: Ms. Brandi Fleming is a 31 y.o. G2P1001 at 4451w5d with Unsure  LMP  Of 03/31/16 with EDD of 01/05/17  with  week 12 ultrasound with EDD of 01/12/17 and pregnancy complicated by persistent asthma presenting for induction of labor.   She has  Not been having contractions and denies leakage of fluid, vaginal bleeding, or decreased fetal movement. Pt has [facility]ll[facility] and is on meds for coverage. Pt is also on Brovana, Pulmicort and Spiriva.    REVIEW OF SYSTEMS: A complete review of systems was performed and was specifically negative for headache, changes in vision, RUQ pain, shortness of breath, chest pain, lower extremity edema and dysuria.   HISTORY:  Past Medical History:  Diagnosis Date  . Asthma   . Bipolar 1 disorder (HCC)   Anemia, Depression, Lactose intolerance, GERD  Past Surgical History:  Procedure Laterality Date  . WISDOM TOOTH EXTRACTION      No current facility-administered medications on file prior to encounter.    Current Outpatient Prescriptions on File Prior to Encounter  Medication Sig Dispense Refill  . arformoterol (BROVANA) 15 MCG/2ML NEBU Take 15 mcg by nebulization 2 (two) times daily.    . chlorpheniramine-HYDROcodone (TUSSIONEX PENNKINETIC ER) 10-8 MG/5ML SUER Take 5 mLs by mouth every 12 (twelve) hours as needed for cough. 60 mL 0  . meclizine (ANTIVERT) 25 MG tablet Take 1 tablet (25 mg total) by mouth 3 (three) times daily. (Patient not taking: Reported on 12/25/2016) 30 tablet 0  . Prenatal Vit-Fe Fumarate-FA (PRENATAL PO) Take 1 tablet by mouth daily.    . promethazine (PHENERGAN) 25 MG tablet Take 1 tablet (25 mg total) by mouth every 6 (six) hours as needed for nausea or vomiting. (Patient not taking: Reported on 12/25/2016) 30 tablet 0  . tiotropium (SPIRIVA) 18 MCG inhalation capsule Place 18 mcg into inhaler and inhale daily.       Allergies  Allergen Reactions  .  Vicodin [Hydrocodone-Acetaminophen] Itching  FMH: Dad: ETOH abuse, DM, Drug abuse, COPD, Epilepsy, CHD, Hepatitis C, BP Colon cancer,  MGM, ] Mom:CHD OB History  Gravida Para Term Preterm AB Living  2 1 1 1   SAB TAB Ectopic Molar Multiple Live Births  1   Gynecologic History: History of Abnormal Pap Smear: Neg Pap History of STI: Neg  Social History  Substance Use Topics  . Smoking status: Former Games developermoker  . Smokeless tobacco: Never Used  . Alcohol use No    PHYSICAL EXAM: Temp:  [98.4 F (36.9 C)-98.6 F (37 C)] 98.6 F (37 C) (06/18 [id number]) Pulse Rate:  [73-86] 73 (06/18 0423) Resp:  [18-20] 20 (06/18 [id number]) BP: (124-160)/(65-108) 124/65 (06/18 0423)  GENERAL: NAD AAOx3 CHEST:CTAB no increased work of breathing CV:RRR no appreciable murmurs, rubs, gallops ABDOMEN: gravid, nontender  EXTREMITIES:  Warm and well-perfused, nontender, nonedematous,  CERVIX:2/60%/vtx-2   FHT:s baseline with  variability accelerations and decelerations  Toco: irreg  DIAGNOSTIC STUDIES:  Recent Labs Lab 12/26/16 0052 12/26/16 2254  WBC 10.0 11.0  HGB 11.0* 11.6*  HCT 31.0* 32.6*  PLT 208 226    PRENATAL STUDIES:  Prenatal Labs:  MBT: O neg ; Rubella immune, Varicella immune, HIV neg, RPR neg, Hep B neg, GC/CT neg, GBS neg , glucola 111, P:C ratio of 230 on 12/25/16  Last US 34 5/7  wks g (51%ile) placenta  above the os (Rt lat) , AF wnl, normal anatomy  ASSESSMENT AND PLAN:  1. Fetal Well being  - Fetal Tracing: reactive 140, +accels, no decels  - Ultrasound: reviewed, as above - Group B Streptococcus: Neg - Presentation: vtx confirmed by RN  2. Routine OB: - Prenatal labs reviewed, as above - Rh O neg  3. Induction of Labor:  -  Contractions external toco in place -  Pelvis proven to  -  Plan for induction with Cytotec  4. Post Partum Planning: - Infant feeding: - Contraception:

## 2016-12-28 LAB — CBC
HCT: 28.9 % — ABNORMAL LOW (ref 35.0–47.0)
HEMOGLOBIN: 9.9 g/dL — AB (ref 12.0–16.0)
MCH: 29.6 pg (ref 26.0–34.0)
MCHC: 34.4 g/dL (ref 32.0–36.0)
MCV: 86.2 fL (ref 80.0–100.0)
PLATELETS: 203 10*3/uL (ref 150–440)
RBC: 3.35 MIL/uL — AB (ref 3.80–5.20)
RDW: 14.2 % (ref 11.5–14.5)
WBC: 12.6 10*3/uL — AB (ref 3.6–11.0)

## 2016-12-28 LAB — FETAL SCREEN: FETAL SCREEN: NEGATIVE

## 2016-12-28 LAB — RPR: RPR Ser Ql: NONREACTIVE

## 2016-12-28 MED ORDER — SENNOSIDES-DOCUSATE SODIUM 8.6-50 MG PO TABS
2.0000 | ORAL_TABLET | ORAL | Status: DC
  Administered 2016-12-29: 2 via ORAL
  Filled 2016-12-28: qty 2

## 2016-12-28 MED ORDER — PRENATAL MULTIVITAMIN CH
1.0000 | ORAL_TABLET | Freq: Every day | ORAL | Status: DC
  Administered 2016-12-28: 1 via ORAL
  Filled 2016-12-28: qty 1

## 2016-12-28 MED ORDER — BISACODYL 10 MG RE SUPP: 10.0000 mg | Freq: Every day | RECTAL | Status: DC | PRN

## 2016-12-28 MED ORDER — ONDANSETRON HCL 4 MG PO TABS: 4.0000 mg | ORAL_TABLET | ORAL | Status: DC | PRN

## 2016-12-28 MED ORDER — ZOLPIDEM TARTRATE 5 MG PO TABS: 5.0000 mg | ORAL_TABLET | Freq: Every evening | ORAL | Status: DC | PRN

## 2016-12-28 MED ORDER — DIPHENHYDRAMINE HCL 25 MG PO CAPS: 25.0000 mg | ORAL_CAPSULE | Freq: Four times a day (QID) | ORAL | Status: DC | PRN

## 2016-12-28 MED ORDER — SIMETHICONE 80 MG PO CHEW
80.0000 mg | CHEWABLE_TABLET | ORAL | Status: DC | PRN
Start: 2016-12-28 — End: 2016-12-29

## 2016-12-28 MED ORDER — COCONUT OIL OIL
1.0000 "application " | TOPICAL_OIL | Status: DC | PRN
  Administered 2016-12-28: 1 via TOPICAL
  Filled 2016-12-28 (×2): qty 120

## 2016-12-28 MED ORDER — FLEET ENEMA 7-19 GM/118ML RE ENEM: 1.0000 | ENEMA | Freq: Every day | RECTAL | Status: DC | PRN

## 2016-12-28 MED ORDER — IBUPROFEN 600 MG PO TABS
600.0000 mg | ORAL_TABLET | Freq: Four times a day (QID) | ORAL | Status: DC
  Administered 2016-12-28: 600 mg via ORAL

## 2016-12-28 MED ORDER — DIBUCAINE 1 % RE OINT
1.0000 "application " | TOPICAL_OINTMENT | RECTAL | Status: DC | PRN
  Administered 2016-12-28: 1 via RECTAL
  Filled 2016-12-28: qty 28

## 2016-12-28 MED ORDER — RHO D IMMUNE GLOBULIN 1500 UNIT/2ML IJ SOSY
300.0000 ug | PREFILLED_SYRINGE | Freq: Once | INTRAMUSCULAR | Status: AC
  Administered 2016-12-28: 300 ug via INTRAVENOUS
  Filled 2016-12-28: qty 2

## 2016-12-28 MED ORDER — ONDANSETRON HCL 4 MG/2ML IJ SOLN: 4.0000 mg | INTRAMUSCULAR | Status: DC | PRN

## 2016-12-28 MED ORDER — BENZOCAINE-MENTHOL 20-0.5 % EX AERO: 1.0000 "application " | INHALATION_SPRAY | CUTANEOUS | Status: DC | PRN

## 2016-12-28 MED ORDER — SODIUM CHLORIDE 0.9 % IV SOLN
250.0000 mL | INTRAVENOUS | Status: DC | PRN
Start: 2016-12-28 — End: 2016-12-29

## 2016-12-28 MED ORDER — IBUPROFEN 600 MG PO TABS
600.0000 mg | ORAL_TABLET | Freq: Four times a day (QID) | ORAL | Status: DC
  Administered 2016-12-28 – 2016-12-29 (×5): 600 mg via ORAL
  Filled 2016-12-28 (×5): qty 1

## 2016-12-28 MED ORDER — ACETAMINOPHEN 325 MG PO TABS
650.0000 mg | ORAL_TABLET | ORAL | Status: DC | PRN
  Administered 2016-12-28 – 2016-12-29 (×3): 650 mg via ORAL
  Filled 2016-12-28 (×3): qty 2

## 2016-12-28 MED ORDER — MEASLES, MUMPS & RUBELLA VAC ~~LOC~~ INJ
0.5000 mL | INJECTION | Freq: Once | SUBCUTANEOUS | Status: DC
  Filled 2016-12-28: qty 0.5

## 2016-12-28 MED ORDER — IBUPROFEN 600 MG PO TABS
ORAL_TABLET | ORAL | Status: AC
  Filled 2016-12-28: qty 1

## 2016-12-28 MED ORDER — SODIUM CHLORIDE 0.9% FLUSH
3.0000 mL | Freq: Two times a day (BID) | INTRAVENOUS | Status: DC
  Administered 2016-12-28: 3 mL via INTRAVENOUS

## 2016-12-28 MED ORDER — MEASLES, MUMPS & RUBELLA VAC ~~LOC~~ INJ: 0.5000 mL | INJECTION | Freq: Once | SUBCUTANEOUS | Status: DC

## 2016-12-28 MED ORDER — IBUPROFEN 600 MG PO TABS: 600.0000 mg | ORAL_TABLET | Freq: Four times a day (QID) | ORAL | Status: DC | PRN

## 2016-12-28 MED ORDER — NIFEDIPINE ER OSMOTIC RELEASE 30 MG PO TB24
30.0000 mg | ORAL_TABLET | Freq: Every day | ORAL | Status: DC
  Administered 2016-12-28 – 2016-12-29 (×2): 30 mg via ORAL
  Filled 2016-12-28 (×2): qty 1

## 2016-12-28 MED ORDER — WITCH HAZEL-GLYCERIN EX PADS
1.0000 "application " | MEDICATED_PAD | CUTANEOUS | Status: DC | PRN
  Administered 2016-12-28: 1 via TOPICAL
  Filled 2016-12-28: qty 100

## 2016-12-28 MED ORDER — SODIUM CHLORIDE 0.9% FLUSH: 3.0000 mL | INTRAVENOUS | Status: DC | PRN

## 2016-12-28 MED ORDER — TETANUS-DIPHTH-ACELL PERTUSSIS 5-2.5-18.5 LF-MCG/0.5 IM SUSP: 0.5000 mL | Freq: Once | INTRAMUSCULAR | Status: DC

## 2016-12-28 NOTE — Progress Notes (Signed)
Spoke to charge nurse regarding recording/collecting of vital signs.

## 2016-12-28 NOTE — Anesthesia Postprocedure Evaluation (Signed)
Anesthesia Post Note  Patient: Denice BorsJosie L Weins  Procedure(s) Performed: * No procedures listed *  Patient location during evaluation: Mother Baby Anesthesia Type: Epidural Level of consciousness: awake, awake and alert and oriented Pain management: pain level controlled Vital Signs Assessment: post-procedure vital signs reviewed and stable Respiratory status: spontaneous breathing Cardiovascular status: blood pressure returned to baseline Postop Assessment: no headache, no signs of nausea or vomiting, adequate PO intake and no backache Anesthetic complications: no     Last Vitals:  Vitals:   12/28/16 0230 12/28/16 0427  BP: 138/88 118/76  Pulse: 87 82  Resp: 20 18  Temp: 36.7 C 36.5 C    Last Pain:  Vitals:   12/28/16 0530  TempSrc:   PainSc: 0-No pain                 Mende Biswell Lawerance CruelStarr

## 2016-12-28 NOTE — Progress Notes (Signed)
Post Partum Day 1 Subjective: Doing well, no complaints.  Tolerating regular diet, pain with PO meds, voiding and ambulating without difficulty.  No CP SOB F/C N/V or leg pain no HA change of vision, RUQ/epigastric pain  Objective: BP 117/74 (BP Location: Left Arm)   Pulse (!) 114   Temp 99 F (37.2 C) (Oral)   Resp 20   Ht 5\' 2"  (1.575 m)   Wt 91.6 kg (202 lb)   LMP 01/13/2016   SpO2 98%   Breastfeeding? Unknown   BMI 36.95 kg/m    Physical Exam:  General: NAD CV: RRR Pulm: nl effort, CTABL Lochia: moderate Uterine Fundus: fundus firm and below umbilicus DVT Evaluation: no cords, ttp LEs    Recent Labs  12/26/16 2254 12/28/16 0611  HGB 11.6* 9.9*  HCT 32.6* 28.9*  WBC 11.0 12.6*  PLT 226 203    Assessment/Plan: 30 y.o. G2P2002 postpartum day # 1  1. Post partum cares:  Routine, continue to encourage lactation, out of bed   May shower   2. Asthma: stable, continue home meds 3. Hypertension:  P/C ratio never returned from initial labs   Some severe yesterday prior to epidural, apparently while in pain.  Mild range since then   No s/sx severe preeclampsia   Continue monitoring 4. Anemia due to blood loss at delivery   Continue prenatal with iron  5 continue inpatient admission     ----- Ranae Plumberhelsea Rishawn Walck, MD Attending Obstetrician and Gynecologist [facility]

## 2016-12-29 LAB — RHOGAM INJECTION: Unit division: 0

## 2016-12-29 LAB — TYPE AND SCREEN
ABO/RH(D): O NEG
ANTIBODY SCREEN: POSITIVE
UNIT DIVISION: 0
UNIT DIVISION: 0

## 2016-12-29 LAB — BPAM RBC
BLOOD PRODUCT EXPIRATION DATE: 201807192359
Blood Product Expiration Date: 201807192359
Unit Type and Rh: 9500
Unit Type and Rh: 9500

## 2016-12-29 MED ORDER — IBUPROFEN 600 MG PO TABS: 600.0000 mg | ORAL_TABLET | Freq: Four times a day (QID) | ORAL | 0 refills | Status: DC

## 2016-12-29 MED ORDER — BENZOCAINE-MENTHOL 20-0.5 % EX AERO: 1.0000 "application " | INHALATION_SPRAY | CUTANEOUS | 1 refills | Status: DC | PRN

## 2016-12-29 MED ORDER — NIFEDIPINE ER 30 MG PO TB24: 30.0000 mg | ORAL_TABLET | Freq: Every day | ORAL | 1 refills | Status: DC

## 2016-12-29 NOTE — Discharge Summary (Signed)
Obstetric Discharge Summary   Patient ID: Patient Name: Brandi Fleming DOB: 08/20/1985 MRN: [id number]  Date of Admission: 12/26/2016 Date of Discharge: 12/29/2016  [facility] Age [facility] Delivery: 5756w5d   Antepartum complications:severe RAD  Admitting Diagnosis:induction for severe RAD  Secondary Diagnoses: Patient Active Problem List   Diagnosis Date Noted  . NSVD (normal spontaneous vaginal delivery) 12/28/2016  . Uterine contractions during pregnancy 12/26/2016  . Supervision of high risk pregnancy in third trimester 12/26/2016  . Pregnancy 11/26/2016  . Shortness of breath in antepartum period 10/22/2016  . Asthma affecting pregnancy, antepartum 09/26/2016  . Bipolar 1 disorder (HCC)     Augmentation: Cytotec Complications: None Intrapartum complications/course:  svd , PP HTN , received rhogam for baby RH + Date of Delivery: 12/27/16 Delivered By: Beatriz StallionJones, C  CNM Delivery Type: spontaneous vaginal delivery Anesthesia: epidural Placenta: sponatneous Laceration:  Episiotomy: none  Newborn Data: Live born female  Birth Weight: 6 lb 2.1 oz (2780 g) APGAR: 6, 9      Postpartum Course  Patient had an uncomplicated postpartum course.  By time of discharge on PPD#2, her pain was controlled on oral pain medications; she had appropriate lochia and was ambulating, voiding without difficulty and tolerating regular diet.  She was deemed stable for discharge to home.     Labs: CBC Latest Ref Rng & Units 12/28/2016 12/26/2016 12/26/2016  WBC 3.6 - 11.0 K/uL 12.6(H) 11.0 10.0  Hemoglobin 12.0 - 16.0 g/dL 1.1(B9.9(L) 11.6(L) 11.0(L)  Hematocrit 35.0 - 47.0 % 28.9(L) 32.6(L) 31.0(L)  Platelets 150 - 440 K/uL 203 226 208   O NEG- baby + : rhogam given   Physical exam:  BP 121/67   Pulse 81   Temp 97.8 F (36.6 C) (Oral)   Resp 20   Ht 5\' 2"  (1.575 m)   Wt 91.6 kg (202 lb)   LMP 01/13/2016   SpO2 96%   Breastfeeding? Unknown   BMI 36.95 kg/m   General: alert and no distress Pulm: normal respiratory effort Lochia: appropriate Abdomen: soft, NT Uterine Fundus: firm, below umbilicus Extremities: No evidence of DVT seen on physical exam. No lower extremity edema.   Disposition: stable, discharge to home Baby Feeding: breastmilkformula Baby Disposition: home with mom  Contraception: none decided  Prenatal Labs:     Plan:  Brandi Fleming was discharged to home in good condition. [facility]  Discharge Instructions: Per After Visit Summary. Activity: Advance as tolerated. Pelvic rest for 6 weeks.  Refer to After Visit Summary Diet: Regular Discharge Medications: Allergies as of 12/29/2016      Reactions   Vicodin [hydrocodone-acetaminophen] Itching      Medication List    TAKE these medications   arformoterol 15 MCG/2ML Nebu Commonly known as:  BROVANA Take 15 mcg by nebulization 2 (two) times daily.   benzocaine-Menthol 20-0.5 % Aero Commonly known as:  DERMOPLAST Apply 1 application topically as needed for irritation (perineal discomfort).   chlorpheniramine-HYDROcodone 10-8 MG/5ML Suer Commonly known as:  TUSSIONEX PENNKINETIC ER Take 5 mLs by mouth every 12 (twelve) hours as needed for cough.   ibuprofen 600 MG tablet Commonly known as:  ADVIL,MOTRIN Take 1 tablet (600 mg total) by mouth every 6 (six) hours.   meclizine 25 MG tablet Commonly known as:  ANTIVERT Take 1 tablet (25 mg total) by mouth 3 (three) times daily.   NIFEdipine 30 MG 24 hr tablet Commonly known as:  PROCARDIA-XL/ADALAT CC Take 1 tablet (  30 mg total) by mouth daily.   PRENATAL PO Take 1 tablet by mouth daily.   promethazine 25 MG tablet Commonly known as:  PHENERGAN Take 1 tablet (25 mg total) by mouth every 6 (six) hours as needed for nausea or vomiting.   tiotropium 18 MCG inhalation capsule Commonly known as:  SPIRIVA Place 18 mcg into inhaler and inhale daily.      Outpatient  follow up:  Follow-up Information    Sharee Pimple, CNM Follow up in 2 week(s).   Specialty:  Obstetrics and Gynecology Why:  bp check  Contact information: [address] [facility] [place] [address] [phone]            Signed:  Ihor Austin Vance Hochmuth  12/29/16

## 2016-12-29 NOTE — Discharge Instructions (Signed)

## 2016-12-29 NOTE — Clinical Social Work Note (Signed)
CSW attempted to see patient this morning prior to discharge however patient was on the phone and informed CSW that I needed to come back in about 10minutes. CSW will attempt to return as time permits. York SpanielMonica Radie Berges MSW,LCSW [phone]

## 2016-12-29 NOTE — Clinical Social Work Maternal (Signed)
  CLINICAL SOCIAL WORK MATERNAL/CHILD NOTE  Patient Details  Name: Brandi Fleming MRN: [id number] Date of Birth: 04/17/1986  Date:  12/29/2016  Clinical Social Worker Initiating Note:  York SpanielMonica Cadince Hilscher MSW,LCSW Date/ Time Initiated:  12/29/16/      Child's Name:      Legal Guardian:  Mother   Need for Interpreter:  None   Date of Referral:        Reason for Referral:  Other (Comment) (discharge planning)   Referral Source:  Physician   Address:     Phone number:      Household Members:  Minor Children, Parents   Natural Supports (not living in the home):  Extended Family   Professional Supports: None   Employment:     Type of Work:     Education:      Architectinancial Resources:  [employer]   Other Resources:  [employer]   Cultural/Religious Considerations Which May Impact Care:  none  Strengths:  Ability to meet basic needs , Compliance with medical plan , Home prepared for child    Risk Factors/Current Problems:  None   Cognitive State:  Alert , Able to Concentrate    Mood/Affect:  Calm , Relaxed    CSW Assessment: CSW returned to patient's room and she was off the phone and breastfeeding. Patient was comfortable with CSW staying. CSW explained role and purpose of visit. Patient stated that father of her baby is in jail and gets one phone call a day and he happened to call her right before CSW coming in earlier. Patient reports that she lives with her mother and 30 year old son. She states she has all supplies for her newborn and all necessities. She has no concerns regarding transportation. She states that she does have a history of bipolar and states she was on medication prior to her being pregnant but came off the medication when she found out she was pregnant. Patient reports that she has had no exacerbation of her bipolar and she is not going to go back on the medication. CSW confirmed that she is aware of signs and symptoms to watch for in regards to postpartum depression  and that her mother will be able to monitor her as well. CSW asked if she felt safe and if she felt her children were safe due to father of baby being currently in jail. Patient did not divulge what he is in jail for but stated that she has no concerns at all regarding their safety. CSW signing off at this time.   CSW Plan/Description:  No Further Intervention Required/No Barriers to Discharge    York SpanielMonica Samit Sylve, LCSW 12/29/2016, 12:21 PM

## 2016-12-29 NOTE — Progress Notes (Signed)
Social worker consult done.  No issues noted.  Discharge instructions reviewed with patient.  Questions answered. Mom and baby wheeled down in wheelchair by volunteers.

## 2017-01-05 ENCOUNTER — Telehealth (HOSPITAL_COMMUNITY): Payer: Self-pay | Admitting: Lactation Services

## 2017-01-05 NOTE — Telephone Encounter (Signed)
Patient is 9 days postpartum & her milk hasn't come to volume. She was pumping 4-5 times/day, but has recently increased it to 6 times/day. Patient reports that she exclusively breast fed for the 1st 4 days. Mom has tried oatmeal, fenugreek, almonds, etc to improve milk supply, but to no avail. Her lochia is WNL. I recommended that Mom call OB to let them know that her milk hasn't yet come to volume. Additionally, I recommended that Mom pump 8 times/day. I suggested that Mom not take fenugreek, as it is not recommended for those with asthma (patient has hx of severe asthma). I suggested that she ask her OB about taking moringa/malunggay, instead.   Brandi HewKim Kable Haywood, RN, IBCLC

## 2017-07-08 IMAGING — CR DG CHEST 2V
1 series · 2 of 2 positions shown · non-contrast
Comparison: 11/28/2015.

CLINICAL DATA: Chest pain.

EXAM:
CHEST  2 VIEW

[Series 1: dg chest 2 view · 0.14mm/px · 2 of 2 slices shown]
[im 1/2]
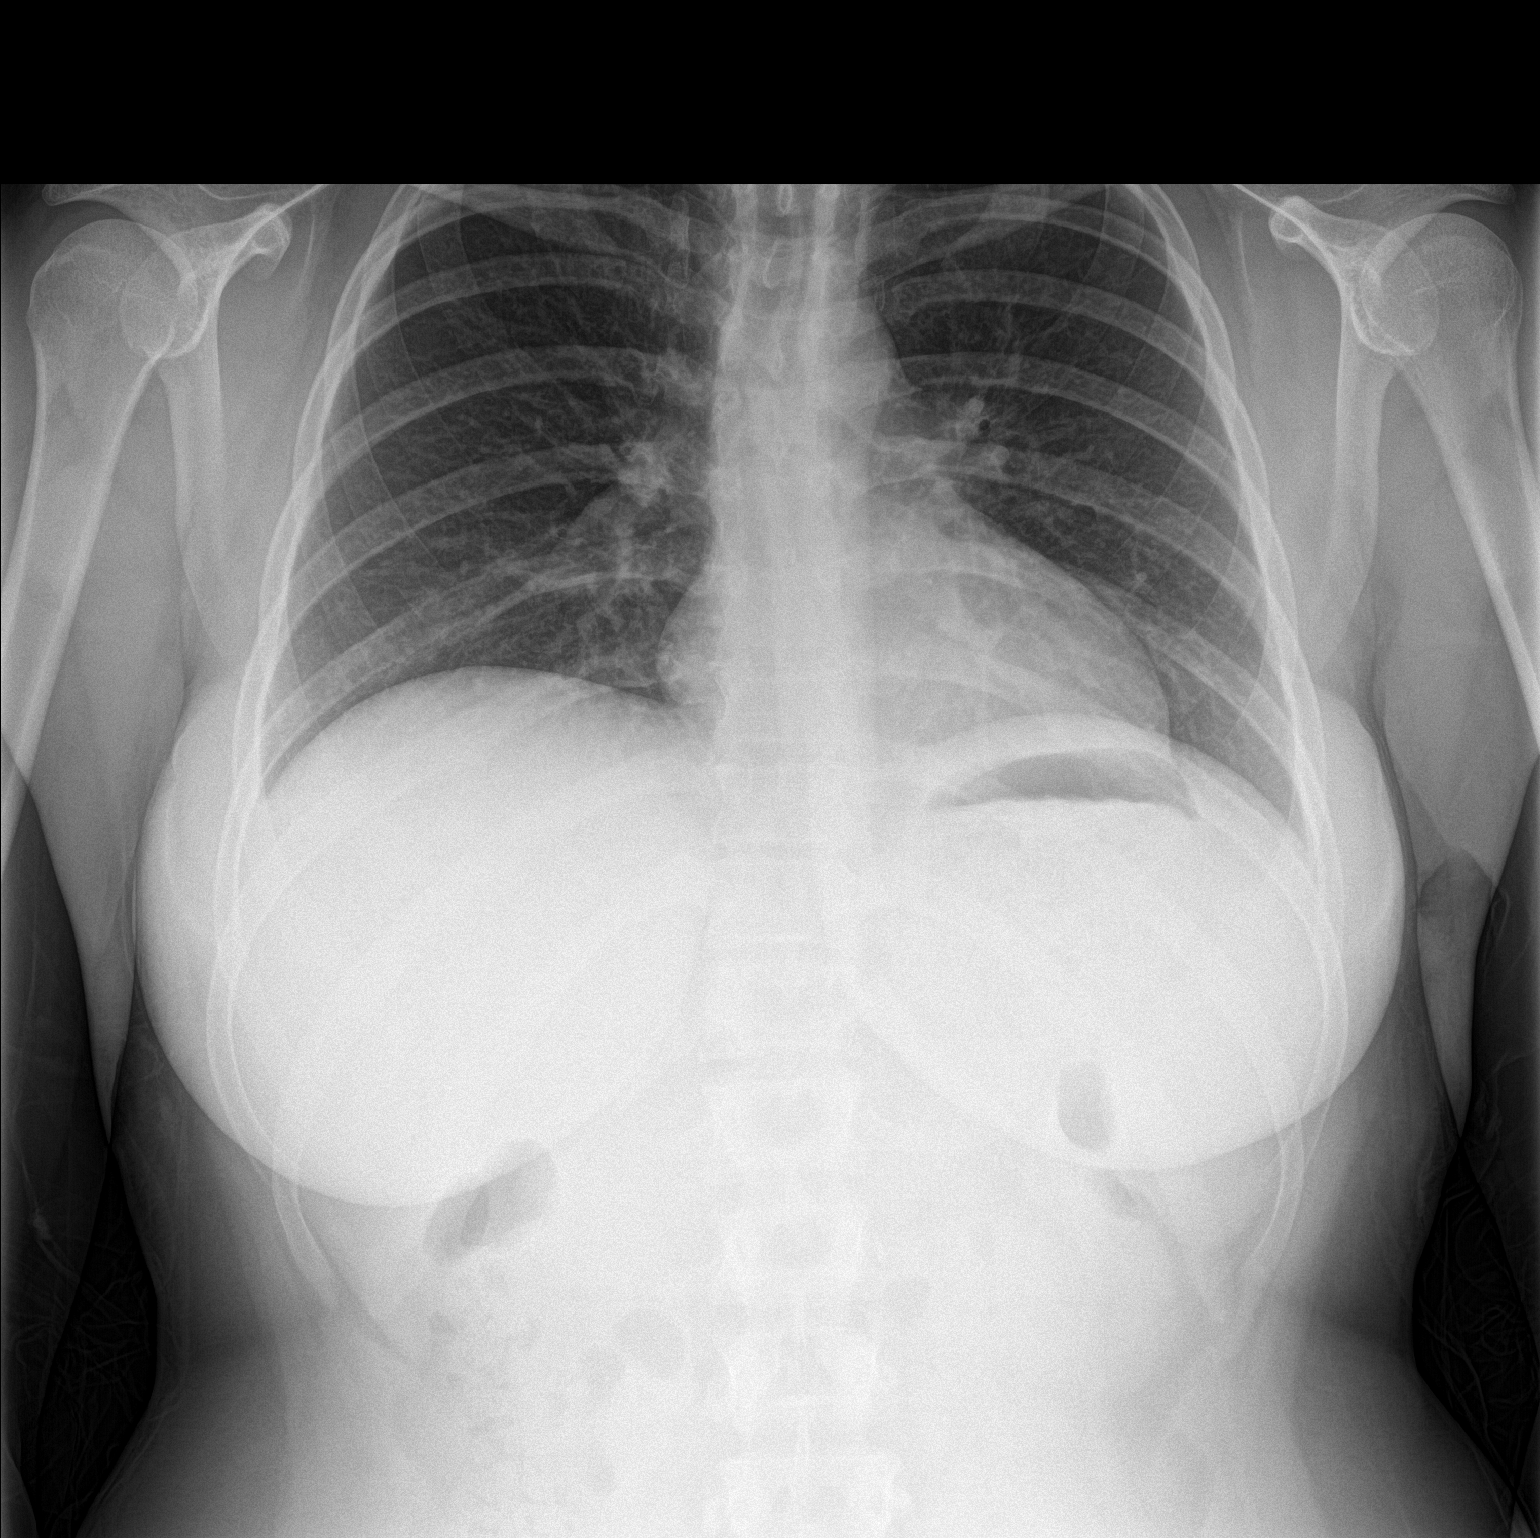
[im 2/2]
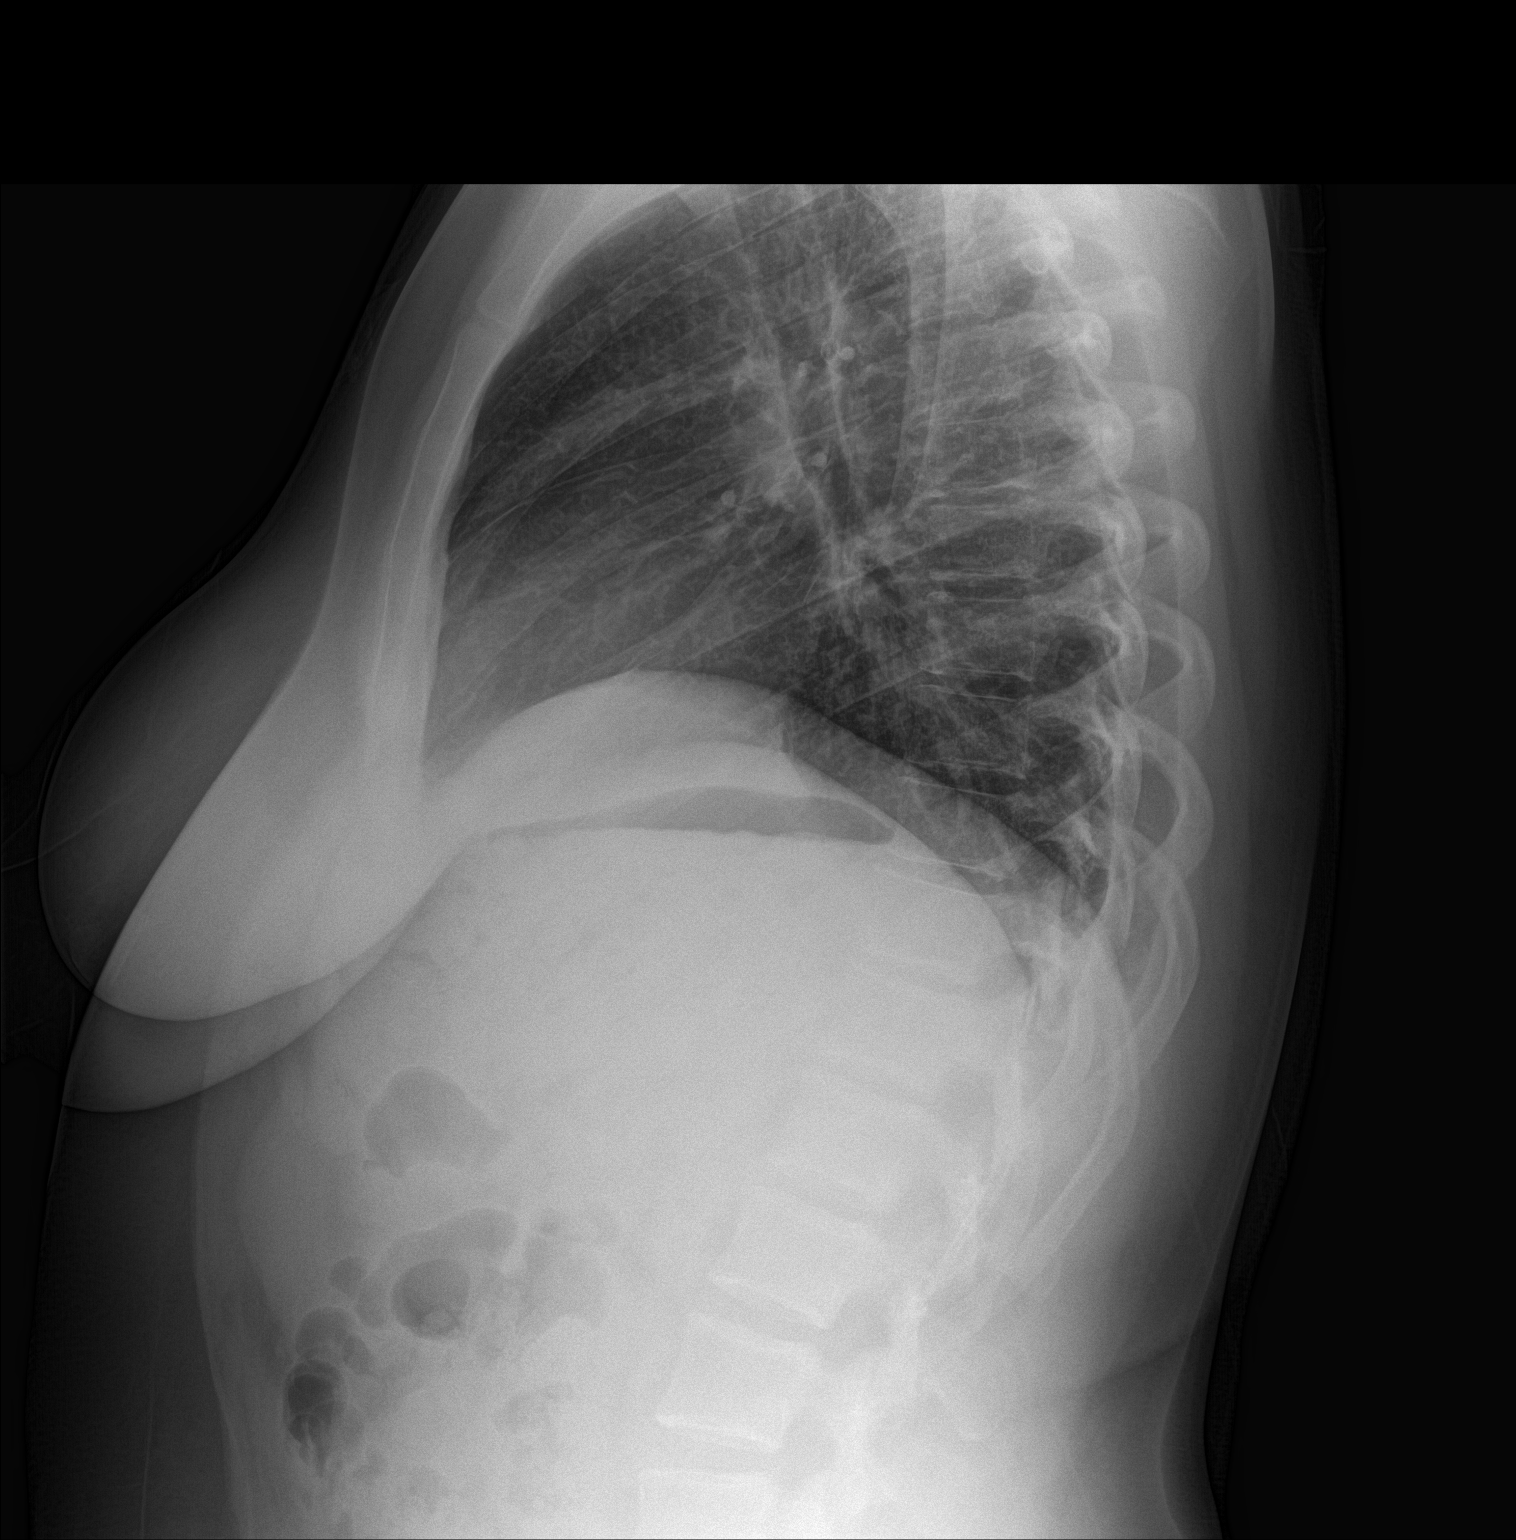

[2 of 2 positions shown; findings below may reference images not displayed]

FINDINGS: Mediastinum hilar structures normal. Low lung volumes. No focal
infiltrate. No pleural effusion or pneumothorax. No acute bony
abnormality.
IMPRESSION: Low lung volumes.  No acute cardiopulmonary disease.

## 2018-03-25 IMAGING — DX DG CHEST 1V
1 series · 1 of 1 positions shown · non-contrast
Comparison: September 26, 2016

CLINICAL DATA: Shortness of breath.  History of asthma

EXAM:
CHEST 1 VIEW

[chest ap]
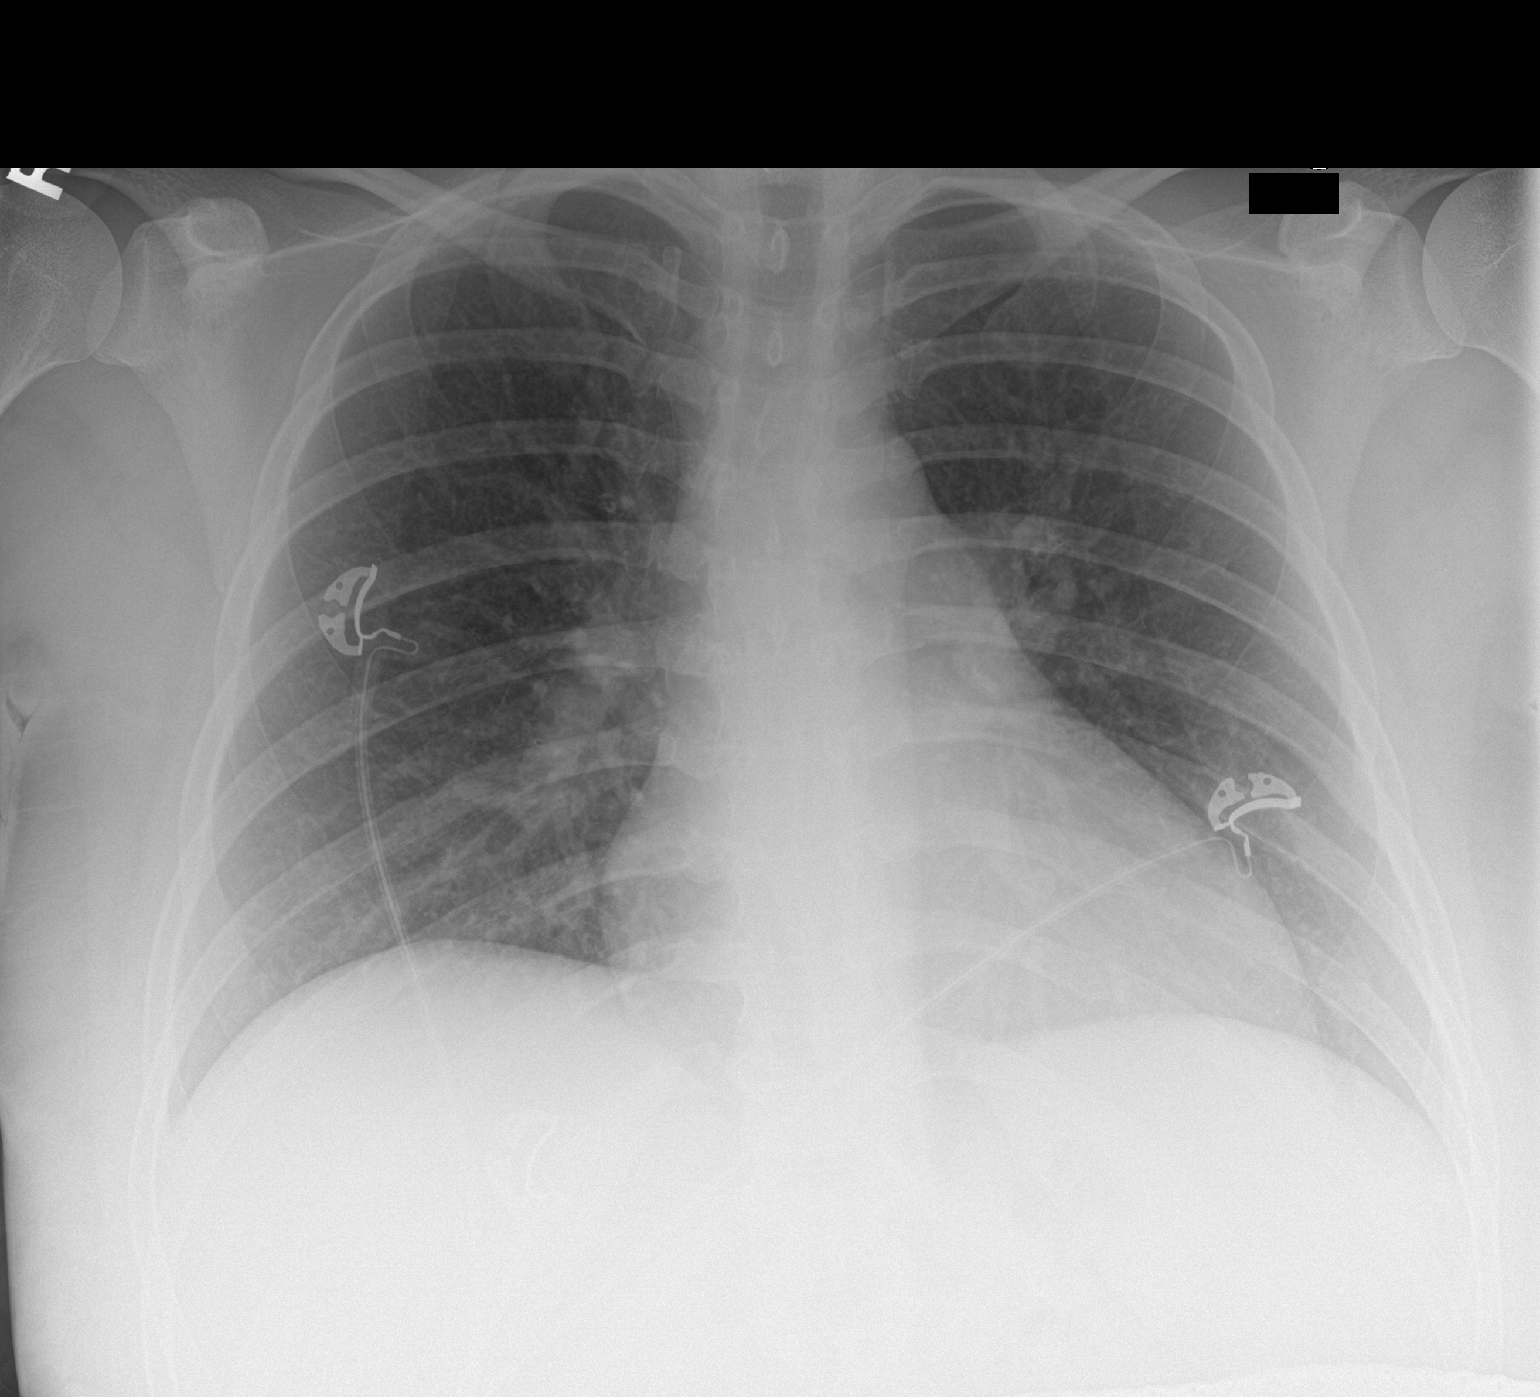

[1 of 1 positions shown; findings below may reference images not displayed]

FINDINGS: Lungs are clear. Heart is upper normal in size with pulmonary
vascularity within normal limits. No adenopathy. No bone lesions. No
pneumothorax.
IMPRESSION: No edema or consolidation.

## 2019-08-26 ENCOUNTER — Other Ambulatory Visit: Payer: Self-pay

## 2019-08-26 ENCOUNTER — Emergency Department (HOSPITAL_BASED_OUTPATIENT_CLINIC_OR_DEPARTMENT_OTHER)
Admission: EM | Admit: 2019-08-26 | Discharge: 2019-08-26 | Disposition: A | Discharge status: Home / Self Care | Payer: Self-pay | Attending: Emergency Medicine | Admitting: Emergency Medicine

## 2019-08-26 ENCOUNTER — Emergency Department (HOSPITAL_BASED_OUTPATIENT_CLINIC_OR_DEPARTMENT_OTHER): Payer: Self-pay

## 2019-08-26 ENCOUNTER — Encounter (HOSPITAL_BASED_OUTPATIENT_CLINIC_OR_DEPARTMENT_OTHER): Payer: Self-pay | Admitting: Emergency Medicine

## 2019-08-26 DIAGNOSIS — Z87891 Personal history of nicotine dependence: Secondary | ICD-10-CM | POA: Insufficient documentation

## 2019-08-26 DIAGNOSIS — Z885 Allergy status to narcotic agent status: Secondary | ICD-10-CM | POA: Insufficient documentation

## 2019-08-26 DIAGNOSIS — N132 Hydronephrosis with renal and ureteral calculous obstruction: Secondary | ICD-10-CM | POA: Insufficient documentation

## 2019-08-26 DIAGNOSIS — Z79899 Other long term (current) drug therapy: Secondary | ICD-10-CM | POA: Insufficient documentation

## 2019-08-26 DIAGNOSIS — N200 Calculus of kidney: Secondary | ICD-10-CM

## 2019-08-26 DIAGNOSIS — J45909 Unspecified asthma, uncomplicated: Secondary | ICD-10-CM | POA: Insufficient documentation

## 2019-08-26 LAB — URINALYSIS, ROUTINE W REFLEX MICROSCOPIC
Bilirubin Urine: NEGATIVE
Glucose, UA: 100 mg/dL — AB
Ketones, ur: NEGATIVE mg/dL
Leukocytes,Ua: NEGATIVE
Nitrite: POSITIVE — AB
Protein, ur: 30 mg/dL — AB
Specific Gravity, Urine: 1.015 (ref 1.005–1.030)
pH: 7 (ref 5.0–8.0)

## 2019-08-26 LAB — URINALYSIS, MICROSCOPIC (REFLEX)

## 2019-08-26 LAB — PREGNANCY, URINE: Preg Test, Ur: NEGATIVE

## 2019-08-26 MED ORDER — KETOROLAC TROMETHAMINE 15 MG/ML IJ SOLN
15.0000 mg | Freq: Once | INTRAMUSCULAR | Status: AC
  Administered 2019-08-26: 15 mg via INTRAMUSCULAR
  Filled 2019-08-26: qty 1

## 2019-08-26 MED ORDER — MORPHINE SULFATE 15 MG PO TABS: 7.5000 mg | ORAL_TABLET | ORAL | 0 refills | Status: DC | PRN

## 2019-08-26 MED ORDER — TAMSULOSIN HCL 0.4 MG PO CAPS: 0.4000 mg | ORAL_CAPSULE | Freq: Every day | ORAL | 0 refills | Status: DC

## 2019-08-26 MED ORDER — ONDANSETRON 4 MG PO TBDP: ORAL_TABLET | ORAL | 0 refills | Status: DC

## 2019-08-26 NOTE — ED Notes (Signed)
Taken to ct at this time. 

## 2019-08-26 NOTE — Discharge Instructions (Signed)

## 2019-08-26 NOTE — ED Triage Notes (Signed)
Pt here with left flank pain since this AM with orange colored urine.

## 2019-08-26 NOTE — ED Notes (Signed)
approx 0400hrs awoke with back pain, tried walking around and pain still there, states when she urinated color was very orange. Pain now radiates to front. Has some nausea with pain intermittently, but no vomiting. Denies having any fevers

## 2019-08-26 NOTE — ED Provider Notes (Signed)
[facility] EMERGENCY DEPARTMENT Provider Note   CSN: [id number] Arrival date & time: 08/26/19  1344     History Chief Complaint  Patient presents with  . Flank Pain  . Dysuria    Brandi Fleming is a 34 y.o. female.  34 yo F with a chief complaint of left flank pain.  Started in her left side and then has radiated down to the left lower quadrant.  Seems to come and go sharp and shooting.  At times she is felt nauseated with it but has not vomited.  Never had pain like this before.  She also noted that her urine has changed color.  Scribes it is more of an orange color.  Denies any over-the-counter supplements or medications.  Denies overt abdominal pain.  She thought she had strained her back because she slept on an air mattress just prior to her symptoms.  Going on for about 24 hours.  She does feel that her symptoms are worse when she urinates but denies burning with urination denies hesitancy denies frequency.  The history is provided by the patient.  Flank Pain This is a new problem. The current episode started 12 to 24 hours ago. The problem occurs constantly. The problem has not changed since onset.Pertinent negatives include no chest pain, no abdominal pain, no headaches and no shortness of breath. Nothing aggravates the symptoms. Nothing relieves the symptoms. She has tried nothing for the symptoms. The treatment provided no relief.  Dysuria Associated symptoms: flank pain   Associated symptoms: no abdominal pain, no fever, no nausea and no vomiting        Past Medical History:  Diagnosis Date  . Asthma   . Bipolar 1 disorder [facility])     Patient Active Problem List   Diagnosis Date Noted  . NSVD (normal spontaneous vaginal delivery) 12/28/2016  . Uterine contractions during pregnancy 12/26/2016  . Supervision of high risk pregnancy in third trimester 12/26/2016  . Pregnancy 11/26/2016  . Shortness of breath in antepartum period 10/22/2016  . Asthma  affecting pregnancy, antepartum 09/26/2016  . Bipolar 1 disorder [facility])     Past Surgical History:  Procedure Laterality Date  . WISDOM TOOTH EXTRACTION       OB History    Gravida  2   Para  2   Term  2   Preterm  0   AB  0   Living  2     SAB      TAB      Ectopic      Multiple  0   Live Births  2        Obstetric Comments  History of chronic asthma        History reviewed. No pertinent family history.  Social History   Tobacco Use  . Smoking status: Former Games developer  . Smokeless tobacco: Never Used  Substance Use Topics  . Alcohol use: No  . Drug use: Not Currently    Home Medications Prior to Admission medications   Medication Sig Start Date End Date Taking? Authorizing Provider  arformoterol (BROVANA) 15 MCG/2ML NEBU Take 15 mcg by nebulization 2 (two) times daily.    [provider]  benzocaine-Menthol (DERMOPLAST) 20-0.5 % AERO Apply 1 application topically as needed for irritation (perineal discomfort). 12/29/16   Schermerhorn, Ihor Austin, MD  chlorpheniramine-HYDROcodone (TUSSIONEX PENNKINETIC ER) 10-8 MG/5ML SUER Take 5 mLs by mouth every 12 (twelve) hours as needed for cough. 11/28/16   Amador Cunas  C, PA-C  ibuprofen (ADVIL,MOTRIN) 600 MG tablet Take 1 tablet (600 mg total) by mouth every 6 (six) hours. 12/29/16   Schermerhorn, Gwen Her, MD  meclizine (ANTIVERT) 25 MG tablet Take 1 tablet (25 mg total) by mouth 3 (three) times daily. Patient not taking: Reported on 12/25/2016 11/26/16   Catheryn Bacon, CNM  morphine (MSIR) 15 MG tablet Take 0.5 tablets (7.5 mg total) by mouth every 4 (four) hours as needed for severe pain. 08/26/19   Deno Etienne, DO  NIFEdipine (PROCARDIA-XL/ADALAT CC) 30 MG 24 hr tablet Take 1 tablet (30 mg total) by mouth daily. 12/29/16   Schermerhorn, Gwen Her, MD  ondansetron (ZOFRAN ODT) 4 MG disintegrating tablet 4mg  ODT q4 hours prn nausea/vomit 08/26/19   Deno Etienne, DO  Prenatal Vit-Fe Fumarate-FA (PRENATAL PO) Take 1  tablet by mouth daily.    [provider]  promethazine (PHENERGAN) 25 MG tablet Take 1 tablet (25 mg total) by mouth every 6 (six) hours as needed for nausea or vomiting. Patient not taking: Reported on 12/25/2016 11/26/16   Catheryn Bacon, CNM  tamsulosin (FLOMAX) 0.4 MG CAPS capsule Take 1 capsule (0.4 mg total) by mouth daily after supper. 08/26/19   Deno Etienne, DO  tiotropium (SPIRIVA) 18 MCG inhalation capsule Place 18 mcg into inhaler and inhale daily.    [provider]    Allergies    Vicodin [hydrocodone-acetaminophen]  Review of Systems   Review of Systems  Constitutional: Negative for chills and fever.  HENT: Negative for congestion and rhinorrhea.   Eyes: Negative for redness and visual disturbance.  Respiratory: Negative for shortness of breath and wheezing.   Cardiovascular: Negative for chest pain and palpitations.  Gastrointestinal: Negative for abdominal pain, nausea and vomiting.  Genitourinary: Positive for dysuria and flank pain. Negative for urgency.  Musculoskeletal: Negative for arthralgias and myalgias.  Skin: Negative for pallor and wound.  Neurological: Negative for dizziness and headaches.    Physical Exam Updated Vital Signs BP 115/75 (BP Location: Right Arm)   Pulse 78   Temp 98.3 F (36.8 C) (Oral)   Resp 18   Ht 5\' 2"  (1.575 m)   Wt 79.4 kg   LMP 08/14/2019 (Approximate)   SpO2 99%   Breastfeeding No Comment: neg upreg  BMI 32.01 kg/m   Physical Exam Vitals and nursing note reviewed.  Constitutional:      General: She is not in acute distress.    Appearance: She is well-developed. She is not diaphoretic.  HENT:     Head: Normocephalic and atraumatic.  Eyes:     Pupils: Pupils are equal, round, and reactive to light.  Cardiovascular:     Rate and Rhythm: Normal rate and regular rhythm.     Heart sounds: No murmur. No friction rub. No gallop.   Pulmonary:     Effort: Pulmonary effort is normal.     Breath sounds: No  wheezing or rales.  Abdominal:     General: There is no distension.     Palpations: Abdomen is soft.     Tenderness: There is no abdominal tenderness.  Musculoskeletal:        General: Tenderness present.     Cervical back: Normal range of motion and neck supple.     Comments: She does have some left paraspinal musculature tenderness its reproducible with palpation.  Skin:    General: Skin is warm and dry.  Neurological:     Mental Status: She is alert and oriented to  person, place, and time.  Psychiatric:        Behavior: Behavior normal.     ED Results / Procedures / Treatments   Labs (all labs ordered are listed, but only abnormal results are displayed) Labs Reviewed  URINALYSIS, ROUTINE W REFLEX MICROSCOPIC - Abnormal; Notable for the following components:      Result Value   Color, Urine ORANGE (*)    APPearance HAZY (*)    Glucose, UA 100 (*)    Hgb urine dipstick LARGE (*)    Protein, ur 30 (*)    Nitrite POSITIVE (*)    All other components within normal limits  URINALYSIS, MICROSCOPIC (REFLEX) - Abnormal; Notable for the following components:   Bacteria, UA FEW (*)    All other components within normal limits  URINE CULTURE  PREGNANCY, URINE    EKG None  Radiology CT Renal Stone Study  Result Date: 08/26/2019 CLINICAL DATA:  LEFT flank pain radiating to LEFT lower quadrant, dysuria. EXAM: CT ABDOMEN AND PELVIS WITHOUT CONTRAST TECHNIQUE: Multidetector CT imaging of the abdomen and pelvis was performed following the standard protocol without IV contrast. COMPARISON:  None. FINDINGS: Lower chest: No acute abnormality. Hepatobiliary: No focal liver abnormality is seen. No gallstones, gallbladder wall thickening, or biliary dilatation. Pancreas: Unremarkable. No pancreatic ductal dilatation or surrounding inflammatory changes. Spleen: Normal in size without focal abnormality. Adrenals/Urinary Tract: Adrenal glands appear normal. Moderate LEFT-sided hydronephrosis. 3  mm stone within the distal LEFT ureter. Bladder is unremarkable, partially decompressed. RIGHT kidney appears normal. Stomach/Bowel: No dilated large or small bowel loops. No evidence of bowel wall inflammation. Appendix is normal. Stomach is unremarkable, partially decompressed. Vascular/Lymphatic: No significant vascular findings are present. No enlarged abdominal or pelvic lymph nodes. Reproductive: Uterus and bilateral adnexa are unremarkable. Other: No free fluid or abscess collection. No free intraperitoneal air. Musculoskeletal: No acute or suspicious osseous finding. IMPRESSION: 3 mm stone within the distal LEFT ureter, located just proximal to the LEFT UVJ, causing moderate LEFT-sided hydronephrosis. Electronically Signed   By: Bary Richard M.D.   On: 08/26/2019 16:09    Procedures Procedures (including critical care time)  Medications Ordered in ED Medications  ketorolac (TORADOL) 15 MG/ML injection 15 mg (15 mg Intramuscular Given 08/26/19 1551)    ED Course  I have reviewed the triage vital signs and the nursing notes.  Pertinent labs & imaging results that were available during my care of the patient were reviewed by me and considered in my medical decision making (see chart for details).    MDM Rules/Calculators/A&P                      34 yo F with a chief complaint of left flank pain.  Going on for since this morning.  Colicky making her nauseated at times.  Also with a change in her urine.  Color seems most likely due to blood in her urine.  No history of stones will obtain a stone study.  States that he is consistent with a left 3 mm UVJ stone with mild hydro-.  Patient's pain is very well controlled at this time.  We will have her follow-up with [facility].  Feel this is less likely to be infected as she has had multiple urinary tract infections in the past and this feels nothing similar no fevers.  5:05 PM:  I have discussed the diagnosis/risks/treatment options with  the patient and believe the pt to be eligible for discharge home  to follow-up with Urology. We also discussed returning to the ED immediately if new or worsening sx occur. We discussed the sx which are most concerning (e.g., sudden worsening pain, fever, inability to tolerate by mouth) that necessitate immediate return. Medications administered to the patient during their visit and any new prescriptions provided to the patient are listed below.  Medications given during this visit Medications  ketorolac (TORADOL) 15 MG/ML injection 15 mg (15 mg Intramuscular Given 08/26/19 1551)     The patient appears reasonably screen and/or stabilized for discharge and I doubt any other medical condition or other [facility] requiring further screening, evaluation, or treatment in the ED at this time prior to discharge.   Final Clinical Impression(s) / ED Diagnoses Final diagnoses:  Nephrolithiasis    Rx / DC Orders ED Discharge Orders         Ordered    morphine (MSIR) 15 MG tablet  Every 4 hours PRN     08/26/19 1641    ondansetron (ZOFRAN ODT) 4 MG disintegrating tablet     08/26/19 1641    tamsulosin (FLOMAX) 0.4 MG CAPS capsule  Daily after supper     08/26/19 1641           Melene Plan, DO 08/26/19 1705

## 2019-08-27 ENCOUNTER — Encounter (HOSPITAL_BASED_OUTPATIENT_CLINIC_OR_DEPARTMENT_OTHER): Payer: Self-pay | Admitting: Emergency Medicine

## 2019-08-27 ENCOUNTER — Other Ambulatory Visit: Payer: Self-pay

## 2019-08-27 ENCOUNTER — Emergency Department (HOSPITAL_BASED_OUTPATIENT_CLINIC_OR_DEPARTMENT_OTHER)
Admission: EM | Admit: 2019-08-27 | Discharge: 2019-08-27 | Disposition: A | Discharge status: Home / Self Care | Payer: Medicaid Other | Attending: Emergency Medicine | Admitting: Emergency Medicine

## 2019-08-27 DIAGNOSIS — N200 Calculus of kidney: Secondary | ICD-10-CM

## 2019-08-27 DIAGNOSIS — J45909 Unspecified asthma, uncomplicated: Secondary | ICD-10-CM | POA: Insufficient documentation

## 2019-08-27 DIAGNOSIS — Z79899 Other long term (current) drug therapy: Secondary | ICD-10-CM | POA: Insufficient documentation

## 2019-08-27 LAB — CBC WITH DIFFERENTIAL/PLATELET
Abs Immature Granulocytes: 0.06 10*3/uL (ref 0.00–0.07)
Basophils Absolute: 0 10*3/uL (ref 0.0–0.1)
Basophils Relative: 0 %
Eosinophils Absolute: 0 10*3/uL (ref 0.0–0.5)
Eosinophils Relative: 0 %
HCT: 41.6 % (ref 36.0–46.0)
Hemoglobin: 13.9 g/dL (ref 12.0–15.0)
Immature Granulocytes: 0 %
Lymphocytes Relative: 7 %
Lymphs Abs: 1.1 10*3/uL (ref 0.7–4.0)
MCH: 29.9 pg (ref 26.0–34.0)
MCHC: 33.4 g/dL (ref 30.0–36.0)
MCV: 89.5 fL (ref 80.0–100.0)
Monocytes Absolute: 0.6 10*3/uL (ref 0.1–1.0)
Monocytes Relative: 4 %
Neutro Abs: 13.8 10*3/uL — ABNORMAL HIGH (ref 1.7–7.7)
Neutrophils Relative %: 89 %
Platelets: 256 10*3/uL (ref 150–400)
RBC: 4.65 MIL/uL (ref 3.87–5.11)
RDW: 13.2 % (ref 11.5–15.5)
WBC: 15.6 10*3/uL — ABNORMAL HIGH (ref 4.0–10.5)
nRBC: 0 % (ref 0.0–0.2)

## 2019-08-27 LAB — BASIC METABOLIC PANEL
Anion gap: 10 (ref 5–15)
BUN: 15 mg/dL (ref 6–20)
CO2: 22 mmol/L (ref 22–32)
Calcium: 8.7 mg/dL — ABNORMAL LOW (ref 8.9–10.3)
Chloride: 106 mmol/L (ref 98–111)
Creatinine, Ser: 1.08 mg/dL — ABNORMAL HIGH (ref 0.44–1.00)
GFR calc Af Amer: >60 mL/min (ref >60)
GFR calc non Af Amer: >60 mL/min (ref >60)
Glucose, Bld: 154 mg/dL — ABNORMAL HIGH (ref 70–99)
Potassium: 3.6 mmol/L (ref 3.5–5.1)
Sodium: 138 mmol/L (ref 135–145)

## 2019-08-27 LAB — URINE CULTURE: Culture: <10000 — AB

## 2019-08-27 MED ORDER — ONDANSETRON HCL 4 MG/2ML IJ SOLN
4.0000 mg | Freq: Once | INTRAMUSCULAR | Status: AC
  Administered 2019-08-27: 05:00:00 4 mg via INTRAVENOUS
  Filled 2019-08-27: qty 2

## 2019-08-27 MED ORDER — OXYCODONE-ACETAMINOPHEN 5-325 MG PO TABS
2.0000 | ORAL_TABLET | Freq: Once | ORAL | Status: AC
  Administered 2019-08-27: 06:00:00 2 via ORAL
  Filled 2019-08-27: qty 2

## 2019-08-27 MED ORDER — KETOROLAC TROMETHAMINE 15 MG/ML IJ SOLN
15.0000 mg | Freq: Once | INTRAMUSCULAR | Status: AC
  Administered 2019-08-27: 05:00:00 15 mg via INTRAVENOUS
  Filled 2019-08-27: qty 1

## 2019-08-27 NOTE — ED Triage Notes (Signed)
Pt reports known kidney stone diagnosed last night in our ED. States pain has gotten worse and she has been vomiting. States "its to the point where I can't take it anymore."

## 2019-08-27 NOTE — Discharge Instructions (Addendum)
You were seen today for ongoing pain related to your kidney stone.  Add 400 mg of ibuprofen every 8 hours to your pain regimen at home.  Make sure that you are staying hydrated.  If you develop worsening vomiting or pain and cannot tolerate fluids, you need to be reevaluated.  You can call the urology office or return to the ED.  If you develop fever this is also a reason to be reevaluated.

## 2019-08-27 NOTE — ED Provider Notes (Signed)
[facility] EMERGENCY DEPARTMENT Provider Note   CSN: [id number] Arrival date & time: 08/27/19  0430     History Chief Complaint  Patient presents with   Flank Pain    Brandi Fleming is a 34 y.o. female.  HPI     This is a 34 year old female who presents with persistent left flank pain.  She was seen and evaluated yesterday and found to have a distal left ureteral stone.  Patient reports that she went home after getting improved in the emergency department.  She took 1 dose of her morphine.  She states approximately 2 hours later she had several episodes of nonbilious, nonbloody emesis.  She states her pain elevated to 10 out of 10.  She describes the pain as left flank pain radiating into her left abdomen.  She previously only had nausea but developed vomiting at home.  Has not developed any fevers.  She did try to take Zofran with minimal relief.  Denies any changes in her urine from prior evaluation.  Chart reviewed.  Distal left 3 mm ureteral stone with mild hydro-.  Urinalysis without UTI.  No other lab work available.  Past Medical History:  Diagnosis Date   Asthma    Bipolar 1 disorder [facility])     Patient Active Problem List   Diagnosis Date Noted   NSVD (normal spontaneous vaginal delivery) 12/28/2016   Uterine contractions during pregnancy 12/26/2016   Supervision of high risk pregnancy in third trimester 12/26/2016   Pregnancy 11/26/2016   Shortness of breath in antepartum period 10/22/2016   Asthma affecting pregnancy, antepartum 09/26/2016   Bipolar 1 disorder (HCC)     Past Surgical History:  Procedure Laterality Date   WISDOM TOOTH EXTRACTION       OB History    Gravida  2   Para  2   Term  2   Preterm  0   AB  0   Living  2     SAB      TAB      Ectopic      Multiple  0   Live Births  2        Obstetric Comments  History of chronic asthma        History reviewed. No pertinent family history.  Social  History   Tobacco Use   Smoking status: Former Smoker   Smokeless tobacco: Never Used  Substance Use Topics   Alcohol use: No   Drug use: Not Currently    Home Medications Prior to Admission medications   Medication Sig Start Date End Date Taking? Authorizing Provider  morphine (MSIR) 15 MG tablet Take 0.5 tablets (7.5 mg total) by mouth every 4 (four) hours as needed for severe pain. 08/26/19  Yes Melene Plan, DO  ondansetron (ZOFRAN ODT) 4 MG disintegrating tablet 4mg  ODT q4 hours prn nausea/vomit 08/26/19  Yes 08/28/19, Dan, DO  arformoterol (BROVANA) 15 MCG/2ML NEBU Take 15 mcg by nebulization 2 (two) times daily.    [provider]  benzocaine-Menthol (DERMOPLAST) 20-0.5 % AERO Apply 1 application topically as needed for irritation (perineal discomfort). 12/29/16   Schermerhorn, 12/31/16, MD  chlorpheniramine-HYDROcodone (TUSSIONEX PENNKINETIC ER) 10-8 MG/5ML SUER Take 5 mLs by mouth every 12 (twelve) hours as needed for cough. 11/28/16   11/30/16, PA-C  ibuprofen (ADVIL,MOTRIN) 600 MG tablet Take 1 tablet (600 mg total) by mouth every 6 (six) hours. 12/29/16   Schermerhorn, 12/31/16, MD  meclizine (ANTIVERT) 25  MG tablet Take 1 tablet (25 mg total) by mouth 3 (three) times daily. Patient not taking: Reported on 12/25/2016 11/26/16   Catheryn Bacon, CNM  NIFEdipine (PROCARDIA-XL/ADALAT CC) 30 MG 24 hr tablet Take 1 tablet (30 mg total) by mouth daily. 12/29/16   Schermerhorn, Gwen Her, MD  Prenatal Vit-Fe Fumarate-FA (PRENATAL PO) Take 1 tablet by mouth daily.    [provider]  promethazine (PHENERGAN) 25 MG tablet Take 1 tablet (25 mg total) by mouth every 6 (six) hours as needed for nausea or vomiting. Patient not taking: Reported on 12/25/2016 11/26/16   Catheryn Bacon, CNM  tamsulosin (FLOMAX) 0.4 MG CAPS capsule Take 1 capsule (0.4 mg total) by mouth daily after supper. 08/26/19   Deno Etienne, DO  tiotropium (SPIRIVA) 18 MCG inhalation capsule Place 18 mcg into  inhaler and inhale daily.    [provider]    Allergies    Vicodin [hydrocodone-acetaminophen]  Review of Systems   Review of Systems  Constitutional: Negative for fever.  Respiratory: Negative for shortness of breath.   Cardiovascular: Negative for chest pain.  Gastrointestinal: Positive for nausea and vomiting. Negative for abdominal pain.  Genitourinary: Positive for flank pain and hematuria. Negative for dysuria.  All other systems reviewed and are negative.   Physical Exam Updated Vital Signs BP 135/79 (BP Location: Right Arm)    Pulse 71    Temp 98 F (36.7 C) (Oral)    Resp 18    Ht 1.575 m (5\' 2" )    Wt 77.1 kg    LMP 08/14/2019 (Approximate)    SpO2 97%    BMI 31.09 kg/m   Physical Exam Vitals and nursing note reviewed.  Constitutional:      Appearance: She is well-developed. She is not ill-appearing.  HENT:     Head: Normocephalic and atraumatic.     Mouth/Throat:     Mouth: Mucous membranes are moist.  Eyes:     Pupils: Pupils are equal, round, and reactive to light.  Cardiovascular:     Rate and Rhythm: Normal rate and regular rhythm.     Heart sounds: Normal heart sounds.  Pulmonary:     Effort: Pulmonary effort is normal. No respiratory distress.     Breath sounds: No wheezing.  Abdominal:     General: Bowel sounds are normal.     Palpations: Abdomen is soft.     Tenderness: There is no right CVA tenderness, left CVA tenderness, guarding or rebound.  Musculoskeletal:     Cervical back: Neck supple.  Skin:    General: Skin is warm and dry.  Neurological:     Mental Status: She is alert and oriented to person, place, and time.  Psychiatric:        Mood and Affect: Mood normal.     ED Results / Procedures / Treatments   Labs (all labs ordered are listed, but only abnormal results are displayed) Labs Reviewed  CBC WITH DIFFERENTIAL/PLATELET - Abnormal; Notable for the following components:      Result Value   WBC 15.6 (*)    Neutro Abs  13.8 (*)    All other components within normal limits  BASIC METABOLIC PANEL - Abnormal; Notable for the following components:   Glucose, Bld 154 (*)    Creatinine, Ser 1.08 (*)    Calcium 8.7 (*)    All other components within normal limits    EKG None  Radiology CT Renal Stone Study  Result Date: 08/26/2019  CLINICAL DATA:  LEFT flank pain radiating to LEFT lower quadrant, dysuria. EXAM: CT ABDOMEN AND PELVIS WITHOUT CONTRAST TECHNIQUE: Multidetector CT imaging of the abdomen and pelvis was performed following the standard protocol without IV contrast. COMPARISON:  None. FINDINGS: Lower chest: No acute abnormality. Hepatobiliary: No focal liver abnormality is seen. No gallstones, gallbladder wall thickening, or biliary dilatation. Pancreas: Unremarkable. No pancreatic ductal dilatation or surrounding inflammatory changes. Spleen: Normal in size without focal abnormality. Adrenals/Urinary Tract: Adrenal glands appear normal. Moderate LEFT-sided hydronephrosis. 3 mm stone within the distal LEFT ureter. Bladder is unremarkable, partially decompressed. RIGHT kidney appears normal. Stomach/Bowel: No dilated large or small bowel loops. No evidence of bowel wall inflammation. Appendix is normal. Stomach is unremarkable, partially decompressed. Vascular/Lymphatic: No significant vascular findings are present. No enlarged abdominal or pelvic lymph nodes. Reproductive: Uterus and bilateral adnexa are unremarkable. Other: No free fluid or abscess collection. No free intraperitoneal air. Musculoskeletal: No acute or suspicious osseous finding. IMPRESSION: 3 mm stone within the distal LEFT ureter, located just proximal to the LEFT UVJ, causing moderate LEFT-sided hydronephrosis. Electronically Signed   By: Bary Richard M.D.   On: 08/26/2019 16:09    Procedures Procedures (including critical care time)  Medications Ordered in ED Medications  ketorolac (TORADOL) 15 MG/ML injection 15 mg (15 mg Intravenous  Given 08/27/19 0508)  ondansetron (ZOFRAN) injection 4 mg (4 mg Intravenous Given 08/27/19 0509)  oxyCODONE-acetaminophen (PERCOCET/ROXICET) 5-325 MG per tablet 2 tablet (2 tablets Oral Given 08/27/19 1308)    ED Course  I have reviewed the triage vital signs and the nursing notes.  Pertinent labs & imaging results that were available during my care of the patient were reviewed by me and considered in my medical decision making (see chart for details).    MDM Rules/Calculators/A&P                       Patient presents with recurrent worsening pain after being diagnosed with a kidney stone earlier today.  She is uncomfortable appearing but nontoxic.  Vital signs are reassuring.  She is afebrile.  I did obtain some basic lab work as she did not have any earlier today.  She was given Toradol and Zofran.  Lab work-up is largely reassuring.  She has a mild nonspecific leukocytosis.  Creatinine is 1.08.  Last creatinine 0.4 2 years ago.  On recheck, she states that she feels somewhat better.  She is able to tolerate fluids without difficulty.  She is already taking morphine, Flomax, and Zofran at home.  Although she has a slight bump in her creatinine from 2 years ago, feel it is reasonable to add 400 mg of ibuprofen for a limited period of time.  Patient does not have insurance and cannot afford further prescription pain medication.  Recommend addition of 400 mg of ibuprofen every 8 hours for 3 to 5 days.  Urology follow-up provided.  Stressed the importance of hydration.  After history, exam, and medical workup I feel the patient has been appropriately medically screened and is safe for discharge home. Pertinent diagnoses were discussed with the patient. Patient was given return precautions.   Final Clinical Impression(s) / ED Diagnoses Final diagnoses:  Kidney stone    Rx / DC Orders ED Discharge Orders    None       Shon Baton, MD 08/27/19 [phone]

## 2020-02-16 ENCOUNTER — Emergency Department (HOSPITAL_BASED_OUTPATIENT_CLINIC_OR_DEPARTMENT_OTHER)
Admission: EM | Admit: 2020-02-16 | Discharge: 2020-02-17 | Disposition: A | Discharge status: Home / Self Care | Payer: Medicaid Other | Attending: Emergency Medicine | Admitting: Emergency Medicine

## 2020-02-16 ENCOUNTER — Other Ambulatory Visit: Payer: Self-pay

## 2020-02-16 ENCOUNTER — Emergency Department (HOSPITAL_BASED_OUTPATIENT_CLINIC_OR_DEPARTMENT_OTHER): Payer: Medicaid Other

## 2020-02-16 ENCOUNTER — Encounter (HOSPITAL_BASED_OUTPATIENT_CLINIC_OR_DEPARTMENT_OTHER): Payer: Self-pay | Admitting: Emergency Medicine

## 2020-02-16 DIAGNOSIS — J45909 Unspecified asthma, uncomplicated: Secondary | ICD-10-CM | POA: Insufficient documentation

## 2020-02-16 DIAGNOSIS — K5900 Constipation, unspecified: Secondary | ICD-10-CM | POA: Insufficient documentation

## 2020-02-16 DIAGNOSIS — Z87891 Personal history of nicotine dependence: Secondary | ICD-10-CM | POA: Insufficient documentation

## 2020-02-16 DIAGNOSIS — K529 Noninfective gastroenteritis and colitis, unspecified: Secondary | ICD-10-CM

## 2020-02-16 DIAGNOSIS — R1084 Generalized abdominal pain: Secondary | ICD-10-CM | POA: Insufficient documentation

## 2020-02-16 LAB — CBC WITH DIFFERENTIAL/PLATELET
Abs Immature Granulocytes: 0.11 10*3/uL — ABNORMAL HIGH (ref 0.00–0.07)
Basophils Absolute: 0.1 10*3/uL (ref 0.0–0.1)
Basophils Relative: 0 %
Eosinophils Absolute: 0.1 10*3/uL (ref 0.0–0.5)
Eosinophils Relative: 0 %
HCT: 44.5 % (ref 36.0–46.0)
Hemoglobin: 15.2 g/dL — ABNORMAL HIGH (ref 12.0–15.0)
Immature Granulocytes: 1 %
Lymphocytes Relative: 8 %
Lymphs Abs: 1.8 10*3/uL (ref 0.7–4.0)
MCH: 30.4 pg (ref 26.0–34.0)
MCHC: 34.2 g/dL (ref 30.0–36.0)
MCV: 89 fL (ref 80.0–100.0)
Monocytes Absolute: 0.7 10*3/uL (ref 0.1–1.0)
Monocytes Relative: 3 %
Neutro Abs: 18.9 10*3/uL — ABNORMAL HIGH (ref 1.7–7.7)
Neutrophils Relative %: 88 %
Platelets: 308 10*3/uL (ref 150–400)
RBC: 5 MIL/uL (ref 3.87–5.11)
RDW: 13.1 % (ref 11.5–15.5)
WBC: 21.6 10*3/uL — ABNORMAL HIGH (ref 4.0–10.5)
nRBC: 0 % (ref 0.0–0.2)

## 2020-02-16 LAB — COMPREHENSIVE METABOLIC PANEL
ALT: 17 U/L (ref 0–44)
AST: 16 U/L (ref 15–41)
Albumin: 4.1 g/dL (ref 3.5–5.0)
Alkaline Phosphatase: 72 U/L (ref 38–126)
Anion gap: 12 (ref 5–15)
BUN: 10 mg/dL (ref 6–20)
CO2: 20 mmol/L — ABNORMAL LOW (ref 22–32)
Calcium: 8.8 mg/dL — ABNORMAL LOW (ref 8.9–10.3)
Chloride: 107 mmol/L (ref 98–111)
Creatinine, Ser: 0.6 mg/dL (ref 0.44–1.00)
GFR calc Af Amer: >60 mL/min (ref >60)
GFR calc non Af Amer: >60 mL/min (ref >60)
Glucose, Bld: 103 mg/dL — ABNORMAL HIGH (ref 70–99)
Potassium: 3.5 mmol/L (ref 3.5–5.1)
Sodium: 139 mmol/L (ref 135–145)
Total Bilirubin: 1.1 mg/dL (ref 0.3–1.2)
Total Protein: 7.4 g/dL (ref 6.5–8.1)

## 2020-02-16 LAB — URINALYSIS, ROUTINE W REFLEX MICROSCOPIC
Bilirubin Urine: NEGATIVE
Glucose, UA: NEGATIVE mg/dL
Hgb urine dipstick: NEGATIVE
Ketones, ur: 15 mg/dL — AB
Leukocytes,Ua: NEGATIVE
Nitrite: NEGATIVE
Protein, ur: NEGATIVE mg/dL
Specific Gravity, Urine: 1.025 (ref 1.005–1.030)
pH: 6 (ref 5.0–8.0)

## 2020-02-16 LAB — LIPASE, BLOOD: Lipase: 20 U/L (ref 11–51)

## 2020-02-16 LAB — PREGNANCY, URINE: Preg Test, Ur: NEGATIVE

## 2020-02-16 MED ORDER — DICYCLOMINE HCL 20 MG PO TABS
20.0000 mg | ORAL_TABLET | Freq: Two times a day (BID) | ORAL | 0 refills | Status: DC
Start: 2020-02-16 — End: 2022-10-27

## 2020-02-16 MED ORDER — DICYCLOMINE HCL 20 MG PO TABS
20.0000 mg | ORAL_TABLET | Freq: Two times a day (BID) | ORAL | 0 refills | Status: DC
Start: 2020-02-16 — End: 2020-02-16

## 2020-02-16 MED ORDER — METRONIDAZOLE 500 MG PO TABS
500.0000 mg | ORAL_TABLET | Freq: Three times a day (TID) | ORAL | 0 refills | Status: DC
Start: 2020-02-16 — End: 2020-02-16

## 2020-02-16 MED ORDER — HYDROMORPHONE HCL 1 MG/ML IJ SOLN
0.5000 mg | Freq: Once | INTRAMUSCULAR | Status: AC
  Administered 2020-02-16: 0.5 mg via INTRAVENOUS
  Filled 2020-02-16: qty 1

## 2020-02-16 MED ORDER — ONDANSETRON HCL 4 MG/2ML IJ SOLN
4.0000 mg | Freq: Once | INTRAMUSCULAR | Status: AC
  Administered 2020-02-16: 4 mg via INTRAVENOUS
  Filled 2020-02-16: qty 2

## 2020-02-16 MED ORDER — METRONIDAZOLE 500 MG PO TABS
500.0000 mg | ORAL_TABLET | Freq: Three times a day (TID) | ORAL | 0 refills | Status: DC
Start: 2020-02-16 — End: 2021-01-29

## 2020-02-16 MED ORDER — IOHEXOL 300 MG/ML  SOLN
100.0000 mL | Freq: Once | INTRAMUSCULAR | Status: AC | PRN
  Administered 2020-02-16: 100 mL via INTRAVENOUS

## 2020-02-16 MED ORDER — SODIUM CHLORIDE 0.9 % IV BOLUS
1000.0000 mL | Freq: Once | INTRAVENOUS | Status: AC
  Administered 2020-02-16: 1000 mL via INTRAVENOUS

## 2020-02-16 NOTE — ED Notes (Signed)
Taken to CT at this time. 

## 2020-02-16 NOTE — ED Notes (Signed)
Safety measures in place, pt states she is very comfortable, denies any pain at this time, IV site WNL, NS cont to infuse as a bolus per orders

## 2020-02-16 NOTE — Discharge Instructions (Addendum)
Please read and follow all provided instructions.  Your diagnoses today include:  1. Generalized abdominal pain     Tests performed today include:  Blood counts and electrolytes - high white blood cell count  Blood tests to check liver and kidney function  Blood tests to check pancreas function  Urine test to look for infection and pregnancy (in women)  CT of your abdomen and pelvis  Vital signs. See below for your results today.   Home care instructions:   Follow any educational materials contained in this packet.  Follow-up instructions: Please follow-up with your primary care provider in the next 2 days for further evaluation of your symptoms.    Return instructions:  SEEK IMMEDIATE MEDICAL ATTENTION IF:  The pain does not go away or becomes severe   A temperature above 101F develops   Repeated vomiting occurs (multiple episodes)   The pain becomes localized to portions of the abdomen. The right side could possibly be appendicitis. In an adult, the left lower portion of the abdomen could be colitis or diverticulitis.   Blood is being passed in stools or vomit (bright red or black tarry stools)   You develop chest pain, difficulty breathing, dizziness or fainting, or become confused, poorly responsive, or inconsolable (young children)  If you have any other emergent concerns regarding your health  Additional Information: Abdominal (belly) pain can be caused by many things. Your caregiver performed an examination and possibly ordered blood/urine tests and imaging (CT scan, x-rays, ultrasound). Many cases can be observed and treated at home after initial evaluation in the emergency department. Even though you are being discharged home, abdominal pain can be unpredictable. Therefore, you need a repeated exam if your pain does not resolve, returns, or worsens. Most patients with abdominal pain don't have to be admitted to the hospital or have surgery, but serious problems  like appendicitis and gallbladder attacks can start out as nonspecific pain. Many abdominal conditions cannot be diagnosed in one visit, so follow-up evaluations are very important.  Your vital signs today were: BP 104/66 (BP Location: Left Arm)   Pulse 66   Temp 98.9 F (37.2 C)   Resp 16   SpO2 99%  If your blood pressure (bp) was elevated above 135/85 this visit, please have this repeated by your doctor within one month. --------------

## 2020-02-16 NOTE — ED Notes (Signed)
In room to start IV and give ordered medications.  Yelling that she has been here for seven hours and we just now told her she needed a urine specimen.  Urinated prior to collection.  Crying that she just wants to leave.  Encouraged to stay so she could get the needed treatment.  Agreed at this time.  IV started and medication provided.  Lights decreased.  Call bell within reach.  Encouraged to call for assistance as needed.

## 2020-02-16 NOTE — ED Provider Notes (Signed)
[facility] EMERGENCY DEPARTMENT Provider Note   CSN: [id number] Arrival date & time: 02/16/20  1755     History Chief Complaint  Patient presents with  . Abdominal Pain    Brandi Fleming is a 34 y.o. female.  Patient with no past abdominal surgical history, history of kidney stones -- presents the emergency department for abdominal pain.  Patient woke around 10 AM today with mild abdominal pain that then progressed during the day.  Patient has severe waves of pain and nausea with occasional episodes of vomiting.  Pain is all of her abdomen.  No hematuria or irritative UTI symptoms including dysuria, increased frequency or urgency.  She states that the pain is similar to kidney stone pain, however it is not in its typical location.  No vaginal bleeding or discharge.  She states that she is due to start her menstrual period, however her symptoms are not typical for period cramps.  She took Tylenol without improvement earlier today.  She has been vaccinated for Covid.  She has had a decreased appetite.        Past Medical History:  Diagnosis Date  . Asthma   . Bipolar 1 disorder [facility])     Patient Active Problem List   Diagnosis Date Noted  . NSVD (normal spontaneous vaginal delivery) 12/28/2016  . Uterine contractions during pregnancy 12/26/2016  . Supervision of high risk pregnancy in third trimester 12/26/2016  . Pregnancy 11/26/2016  . Shortness of breath in antepartum period 10/22/2016  . Asthma affecting pregnancy, antepartum 09/26/2016  . Bipolar 1 disorder [facility])     Past Surgical History:  Procedure Laterality Date  . WISDOM TOOTH EXTRACTION       OB History    Gravida  2   Para  2   Term  2   Preterm  0   AB  0   Living  2     SAB      TAB      Ectopic      Multiple  0   Live Births  2        Obstetric Comments  History of chronic asthma        History reviewed. No pertinent family history.  Social History   Tobacco Use   . Smoking status: Former Games developer  . Smokeless tobacco: Never Used  Substance Use Topics  . Alcohol use: No  . Drug use: Not Currently    Home Medications Prior to Admission medications   Medication Sig Start Date End Date Taking? Authorizing Provider  arformoterol (BROVANA) 15 MCG/2ML NEBU Take 15 mcg by nebulization 2 (two) times daily.    [provider]  benzocaine-Menthol (DERMOPLAST) 20-0.5 % AERO Apply 1 application topically as needed for irritation (perineal discomfort). 12/29/16   Schermerhorn, Ihor Austin, MD  chlorpheniramine-HYDROcodone (TUSSIONEX PENNKINETIC ER) 10-8 MG/5ML SUER Take 5 mLs by mouth every 12 (twelve) hours as needed for cough. 11/28/16   Evon Slack, PA-C  ibuprofen (ADVIL,MOTRIN) 600 MG tablet Take 1 tablet (600 mg total) by mouth every 6 (six) hours. 12/29/16   Schermerhorn, Ihor Austin, MD  meclizine (ANTIVERT) 25 MG tablet Take 1 tablet (25 mg total) by mouth 3 (three) times daily. Patient not taking: Reported on 12/25/2016 11/26/16   Sharee Pimple, CNM  morphine (MSIR) 15 MG tablet Take 0.5 tablets (7.5 mg total) by mouth every 4 (four) hours as needed for severe pain. 08/26/19   Melene Plan, DO  NIFEdipine (PROCARDIA-XL/ADALAT  CC) 30 MG 24 hr tablet Take 1 tablet (30 mg total) by mouth daily. 12/29/16   Schermerhorn, Ihor Austin, MD  ondansetron (ZOFRAN ODT) 4 MG disintegrating tablet 4mg  ODT q4 hours prn nausea/vomit 08/26/19   08/28/19, DO  Prenatal Vit-Fe Fumarate-FA (PRENATAL PO) Take 1 tablet by mouth daily.    [provider]  promethazine (PHENERGAN) 25 MG tablet Take 1 tablet (25 mg total) by mouth every 6 (six) hours as needed for nausea or vomiting. Patient not taking: Reported on 12/25/2016 11/26/16   11/28/16, CNM  tamsulosin (FLOMAX) 0.4 MG CAPS capsule Take 1 capsule (0.4 mg total) by mouth daily after supper. 08/26/19   08/28/19, DO  tiotropium (SPIRIVA) 18 MCG inhalation capsule Place 18 mcg into inhaler and inhale daily.     [provider]    Allergies    Vicodin [hydrocodone-acetaminophen]  Review of Systems   Review of Systems  Constitutional: Positive for appetite change. Negative for fever.  HENT: Negative for rhinorrhea and sore throat.   Eyes: Negative for redness.  Respiratory: Negative for cough.   Cardiovascular: Negative for chest pain.  Gastrointestinal: Positive for abdominal pain, constipation, nausea and vomiting. Negative for anal bleeding and diarrhea.  Genitourinary: Negative for dysuria, frequency, hematuria and urgency.  Musculoskeletal: Negative for myalgias.  Skin: Negative for rash.  Neurological: Negative for headaches.    Physical Exam Updated Vital Signs BP (!) 155/85   Pulse 78   Temp 98.9 F (37.2 C)   Resp 18   SpO2 100%   Physical Exam Vitals and nursing note reviewed.  Constitutional:      General: She is not in acute distress.    Appearance: She is well-developed.  HENT:     Head: Normocephalic and atraumatic.     Right Ear: External ear normal.     Left Ear: External ear normal.     Nose: Nose normal.  Eyes:     Conjunctiva/sclera: Conjunctivae normal.  Cardiovascular:     Rate and Rhythm: Normal rate and regular rhythm.     Heart sounds: No murmur heard.   Pulmonary:     Effort: No respiratory distress.     Breath sounds: No wheezing, rhonchi or rales.  Abdominal:     Palpations: Abdomen is soft.     Tenderness: There is generalized abdominal tenderness (Mild to moderate, no focality). There is no guarding or rebound. Negative signs include Murphy's sign and McBurney's sign.  Musculoskeletal:     Cervical back: Normal range of motion and neck supple.     Right lower leg: No edema.     Left lower leg: No edema.  Skin:    General: Skin is warm and dry.     Findings: No rash.  Neurological:     General: No focal deficit present.     Mental Status: She is alert. Mental status is at baseline.     Motor: No weakness.  Psychiatric:         Mood and Affect: Mood normal.     ED Results / Procedures / Treatments   Labs (all labs ordered are listed, but only abnormal results are displayed) Labs Reviewed  CBC WITH DIFFERENTIAL/PLATELET - Abnormal; Notable for the following components:      Result Value   WBC 21.6 (*)    Hemoglobin 15.2 (*)    Neutro Abs 18.9 (*)    Abs Immature Granulocytes 0.11 (*)    All other components within normal  limits  COMPREHENSIVE METABOLIC PANEL - Abnormal; Notable for the following components:   CO2 20 (*)    Glucose, Bld 103 (*)    Calcium 8.8 (*)    All other components within normal limits  URINALYSIS, ROUTINE W REFLEX MICROSCOPIC - Abnormal; Notable for the following components:   Ketones, ur 15 (*)    All other components within normal limits  LIPASE, BLOOD  PREGNANCY, URINE    EKG None  Radiology No results found.  Procedures Procedures (including critical care time)  Medications Ordered in ED Medications  HYDROmorphone (DILAUDID) injection 0.5 mg (has no administration in time range)  HYDROmorphone (DILAUDID) injection 0.5 mg (0.5 mg Intravenous Given 02/16/20 2053)  ondansetron (ZOFRAN) injection 4 mg (4 mg Intravenous Given 02/16/20 2053)  sodium chloride 0.9 % bolus 1,000 mL (1,000 mLs Intravenous New Bag/Given 02/16/20 2111)  iohexol (OMNIPAQUE) 300 MG/ML solution 100 mL (100 mLs Intravenous Contrast Given 02/16/20 2302)    ED Course  I have reviewed the triage vital signs and the nursing notes.  Pertinent labs & imaging results that were available during my care of the patient were reviewed by me and considered in my medical decision making (see chart for details).  Patient seen and examined. Work-up initiated. Medications ordered.  No focal tenderness on exam.  Patient appears somewhat uncomfortable and moves around in the bed fluidly without guarding.   Vital signs reviewed and are as follows: BP (!) 155/85   Pulse 78   Temp 98.9 F (37.2 C)   Resp 18   SpO2 100%     9:44 PM patient much improved after IV pain medication.  Her white blood cell count is 21,000.  Given abdominal pain with this white blood cell count, recommended CT imaging of the abdomen.  Patient is in agreement.  11:07 PM Awaiting CT -- pain returning, Additional medication ordered.   Signout to Dr. Nicanor Alcon at shift change.     MDM Rules/Calculators/A&P                          Generalized abd pain, WBC 21k. Awaiting imaging. Neg UPT, UA. Other labs reassuring.    Final Clinical Impression(s) / ED Diagnoses Final diagnoses:  Generalized abdominal pain    Rx / DC Orders ED Discharge Orders    None       Renne Crigler, PA-C 02/16/20 2308    Charlynne Pander, MD 02/17/20 [phone]

## 2020-02-16 NOTE — ED Triage Notes (Signed)
PT here with 10/10 generalized abdominal pain. N/V since this morning.

## 2020-02-28 ENCOUNTER — Other Ambulatory Visit: Payer: Medicaid Other

## 2020-02-28 ENCOUNTER — Other Ambulatory Visit: Payer: Self-pay

## 2020-02-28 DIAGNOSIS — Z20822 Contact with and (suspected) exposure to covid-19: Secondary | ICD-10-CM

## 2020-02-29 LAB — SARS-COV-2, NAA 2 DAY TAT

## 2020-02-29 LAB — NOVEL CORONAVIRUS, NAA: SARS-CoV-2, NAA: NOT DETECTED

## 2021-01-29 ENCOUNTER — Encounter (HOSPITAL_BASED_OUTPATIENT_CLINIC_OR_DEPARTMENT_OTHER): Payer: Self-pay | Admitting: *Deleted

## 2021-01-29 ENCOUNTER — Emergency Department (HOSPITAL_BASED_OUTPATIENT_CLINIC_OR_DEPARTMENT_OTHER)
Admission: EM | Admit: 2021-01-29 | Discharge: 2021-01-29 | Disposition: A | Discharge status: Home / Self Care | Payer: Medicaid Other | Attending: Emergency Medicine | Admitting: Emergency Medicine

## 2021-01-29 ENCOUNTER — Other Ambulatory Visit: Payer: Self-pay

## 2021-01-29 DIAGNOSIS — R000 Tachycardia, unspecified: Secondary | ICD-10-CM | POA: Insufficient documentation

## 2021-01-29 DIAGNOSIS — J45909 Unspecified asthma, uncomplicated: Secondary | ICD-10-CM | POA: Insufficient documentation

## 2021-01-29 DIAGNOSIS — J069 Acute upper respiratory infection, unspecified: Secondary | ICD-10-CM

## 2021-01-29 DIAGNOSIS — Z20822 Contact with and (suspected) exposure to covid-19: Secondary | ICD-10-CM

## 2021-01-29 DIAGNOSIS — U071 COVID-19: Secondary | ICD-10-CM | POA: Insufficient documentation

## 2021-01-29 LAB — RESP PANEL BY RT-PCR (FLU A&B, COVID) ARPGX2
Influenza A by PCR: NEGATIVE
Influenza B by PCR: NEGATIVE
SARS Coronavirus 2 by RT PCR: POSITIVE — AB

## 2021-01-29 LAB — PREGNANCY, URINE: Preg Test, Ur: NEGATIVE

## 2021-01-29 MED ORDER — ACETAMINOPHEN 500 MG PO TABS
1000.0000 mg | ORAL_TABLET | Freq: Once | ORAL | Status: AC
  Administered 2021-01-29: 1000 mg via ORAL
  Filled 2021-01-29: qty 2

## 2021-01-29 MED ORDER — LIDOCAINE VISCOUS HCL 2 % MT SOLN
15.0000 mL | OROMUCOSAL | 0 refills | Status: AC | PRN
Start: 2021-01-29 — End: ?

## 2021-01-29 MED ORDER — LIDOCAINE VISCOUS HCL 2 % MT SOLN
15.0000 mL | Freq: Once | OROMUCOSAL | Status: AC
  Administered 2021-01-29: 15 mL via OROMUCOSAL
  Filled 2021-01-29: qty 15

## 2021-01-29 NOTE — ED Triage Notes (Signed)
Headache, sore throat and nasal congestion since yesterday.  Denies fever.

## 2021-01-29 NOTE — Discharge Instructions (Addendum)
If you develop high fever, severe cough or cough with blood, trouble breathing, severe headache, neck pain/stiffness, vomiting, or any other new/concerning symptoms then return to the ER for evaluation  

## 2021-01-29 NOTE — ED Notes (Signed)
Patient discharged from Facility by Previous Staff. This RN had no interaction with the patient but will be discharged from Epic by this RN.

## 2021-01-29 NOTE — ED Provider Notes (Signed)
[facility] EMERGENCY DEPT Provider Note   CSN: [id number] Arrival date & time: 01/29/21  1347     History Chief Complaint  Patient presents with   Headache   Nasal Congestion   Sore Throat    Brandi Fleming is a 35 y.o. female.  HPI 35 year old female presents with cough, congestion, sore throat, headache.  All the symptoms started last night.  No fevers.  Has not take anything for the pain.  Took 2 home COVID test that were both negative.  She is here for testing as she was make sure she is okay to go back to work.  She has received COVID vaccines.  Her throat feels like glass.  She is able to swallow but is very painful.  She is had some green nasal discharge.  Past Medical History:  Diagnosis Date   Asthma    Bipolar 1 disorder [facility])     Patient Active Problem List   Diagnosis Date Noted   NSVD (normal spontaneous vaginal delivery) 12/28/2016   Uterine contractions during pregnancy 12/26/2016   Supervision of high risk pregnancy in third trimester 12/26/2016   Pregnancy 11/26/2016   Shortness of breath in antepartum period 10/22/2016   Asthma affecting pregnancy, antepartum 09/26/2016   Bipolar 1 disorder (HCC)     Past Surgical History:  Procedure Laterality Date   WISDOM TOOTH EXTRACTION       OB History     Gravida  2   Para  2   Term  2   Preterm  0   AB  0   Living  2      SAB      IAB      Ectopic      Multiple  0   Live Births  2        Obstetric Comments  History of chronic asthma         History reviewed. No pertinent family history.  Social History   Tobacco Use   Smoking status: Never   Smokeless tobacco: Never  Vaping Use   Vaping Use: Never used  Substance Use Topics   Alcohol use: Yes    Comment: occasionally   Drug use: Yes    Types: Marijuana    Comment: yesterday    Home Medications Prior to Admission medications   Medication Sig Start Date End Date Taking? Authorizing Provider   albuterol (VENTOLIN HFA) 108 (90 Base) MCG/ACT inhaler Inhale into the lungs every 6 (six) hours as needed for wheezing or shortness of breath.   Yes [provider]  DULoxetine (CYMBALTA) 60 MG capsule Take 60 mg by mouth daily.   Yes [provider]  lidocaine (XYLOCAINE) 2 % solution Use as directed 15 mLs in the mouth or throat every 4 (four) hours as needed for mouth pain. 01/29/21  Yes Pricilla Loveless, MD  dicyclomine (BENTYL) 20 MG tablet Take 1 tablet (20 mg total) by mouth 2 (two) times daily. 02/16/20   Charlynne Pander, MD    Allergies    Vicodin [hydrocodone-acetaminophen]  Review of Systems   Review of Systems  HENT:  Positive for congestion and sore throat.   Respiratory:  Positive for cough. Negative for shortness of breath.   Gastrointestinal:  Positive for diarrhea.  Neurological:  Positive for headaches.   Physical Exam Updated Vital Signs BP (!) 120/101 (BP Location: Right Arm)   Pulse (!) 119   Temp 98.7 F (37.1 C)   Resp 18  Ht 5\' 2"  (1.575 m)   Wt 90.7 kg   LMP 01/12/2021   SpO2 98%   BMI 36.58 kg/m   Physical Exam Vitals and nursing note reviewed.  Constitutional:      Appearance: She is well-developed.  HENT:     Head: Normocephalic and atraumatic.     Right Ear: External ear normal.     Left Ear: External ear normal.     Nose: Nose normal.     Mouth/Throat:     Pharynx: Uvula midline. Posterior oropharyngeal erythema (mild) present.     Tonsils: No tonsillar exudate or tonsillar abscesses.  Eyes:     General:        Right eye: No discharge.        Left eye: No discharge.  Cardiovascular:     Rate and Rhythm: Regular rhythm. Tachycardia present.     Heart sounds: Normal heart sounds.     Comments: HR between 105-110 while I'm in room Pulmonary:     Effort: Pulmonary effort is normal.     Breath sounds: Normal breath sounds. No wheezing.  Abdominal:     Palpations: Abdomen is soft.     Tenderness: There is no abdominal  tenderness.  Skin:    General: Skin is warm and dry.  Neurological:     Mental Status: She is alert.  Psychiatric:        Mood and Affect: Mood is not anxious.    ED Results / Procedures / Treatments   Labs (all labs ordered are listed, but only abnormal results are displayed) Labs Reviewed  RESP PANEL BY RT-PCR (FLU A&B, COVID) ARPGX2  PREGNANCY, URINE    EKG None  Radiology No results found.  Procedures Procedures   Medications Ordered in ED Medications  acetaminophen (TYLENOL) tablet 1,000 mg (1,000 mg Oral Given 01/29/21 1454)  lidocaine (XYLOCAINE) 2 % viscous mouth solution 15 mL (15 mLs Mouth/Throat Given 01/29/21 1455)    ED Course  I have reviewed the triage vital signs and the nursing notes.  Pertinent labs & imaging results that were available during my care of the patient were reviewed by me and considered in my medical decision making (see chart for details).    MDM Rules/Calculators/A&P                           Presentation is most consistent with a viral respiratory infection.  Concerning for COVID-19.  She is tachycardic on arrival though has a clear voice with no trouble breathing.  When she rests heart rate definitely improves into the 105 range while I am in the room.  She otherwise has clear lungs.  I will give viscous lidocaine for the sore throat in addition to Tylenol.  COVID testing was sent from triage.  Either way I do not think she has a bacterial infection and appears stable for discharge home with supportive care. Final Clinical Impression(s) / ED Diagnoses Final diagnoses:  Suspected COVID-19 virus infection  Viral upper respiratory tract infection with cough    Rx / DC Orders ED Discharge Orders          Ordered    lidocaine (XYLOCAINE) 2 % solution  Every 4 hours PRN        01/29/21 1453             01/31/21, MD 01/29/21 1510

## 2021-07-19 IMAGING — CT CT ABD-PELV W/ CM
2 of 4 series · 16 of 46 positions shown, 18 images · IV contrast (omnipaque)
Comparison: August 26, 2019

CLINICAL DATA: Abdominal pain.

EXAM:
CT ABDOMEN AND PELVIS WITH CONTRAST
TECHNIQUE: Multidetector CT imaging of the abdomen and pelvis was performed
using the standard protocol following bolus administration of
intravenous contrast.
CONTRAST:  100mL OMNIPAQUE IOHEXOL 300 MG/ML  SOLN

[Series 2: axial st · axial · 0.78mm/px · z∈[-346,+74]mm · 13 of 92 slices shown, 15 images]
[im 4/92  soft-tissue]
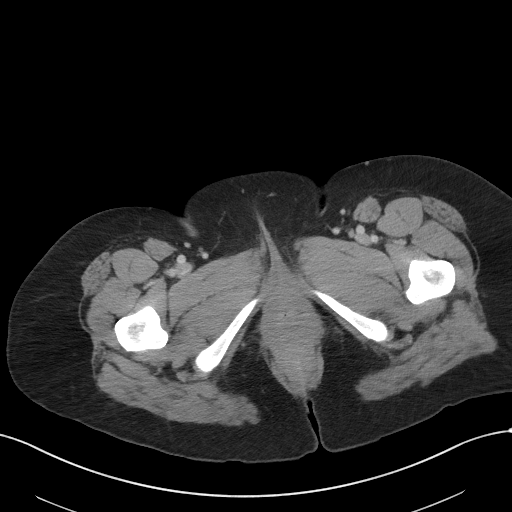
[im 4/92  bone]
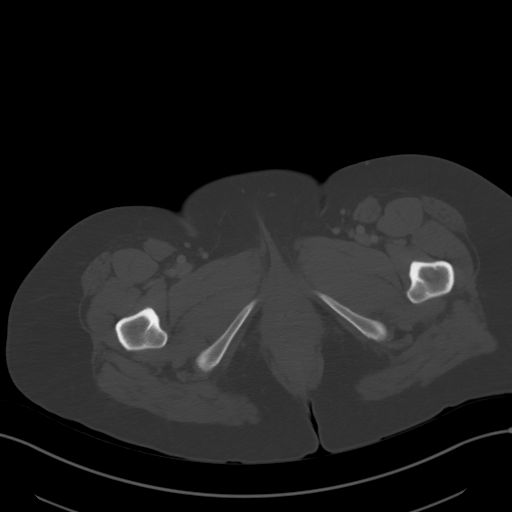
[im 12/92  soft-tissue]
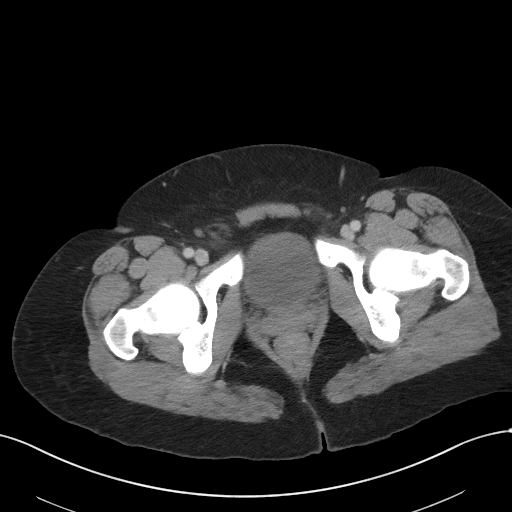
[im 19/92  soft-tissue]
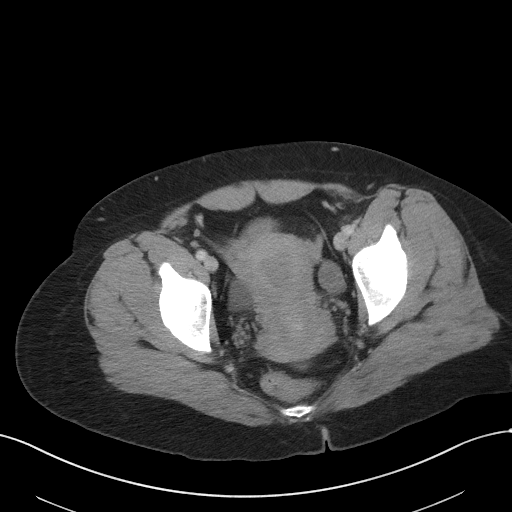
[im 27/92  soft-tissue]
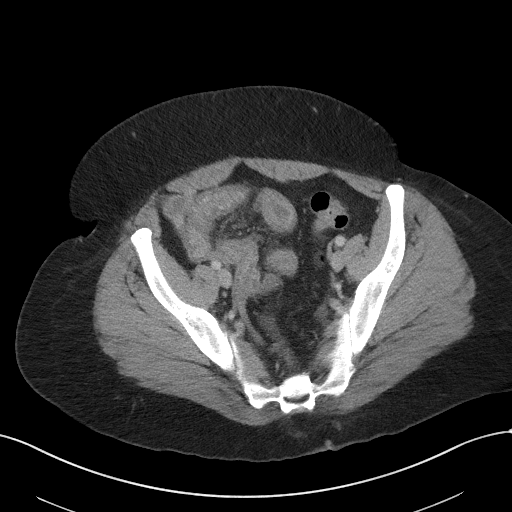
[im 31/92  soft-tissue]
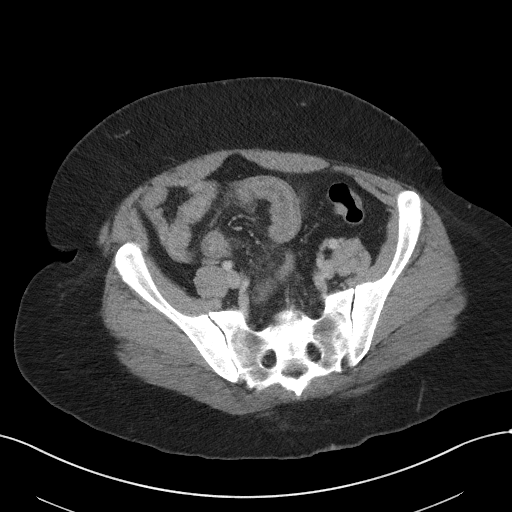
[im 38/92  soft-tissue]
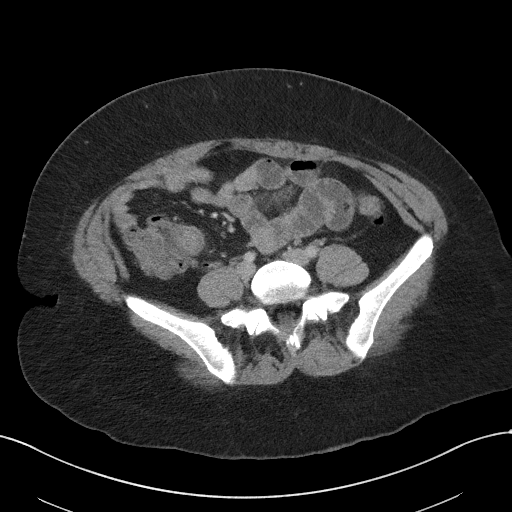
[im 46/92  soft-tissue]
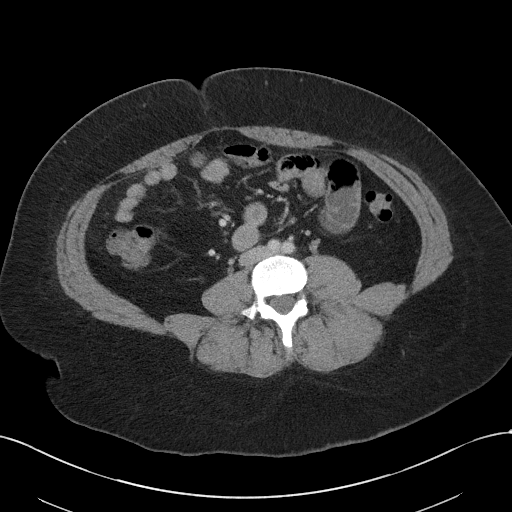
[im 54/92  soft-tissue]
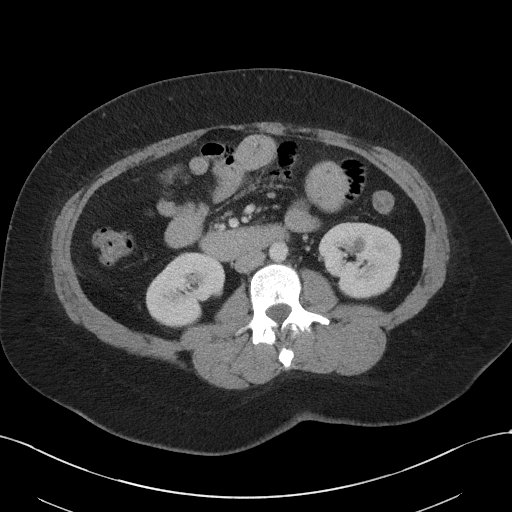
[im 61/92  soft-tissue]
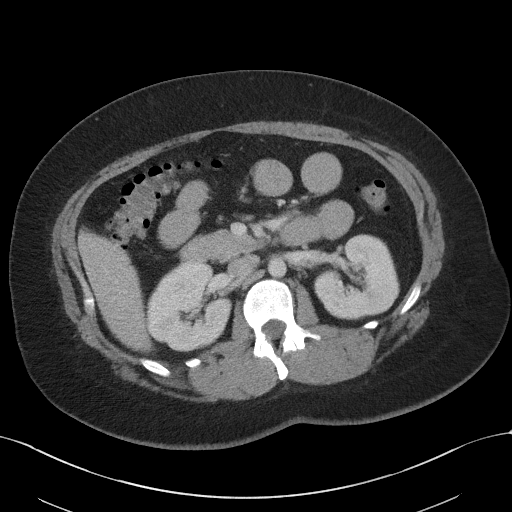
[im 61/92  bone]
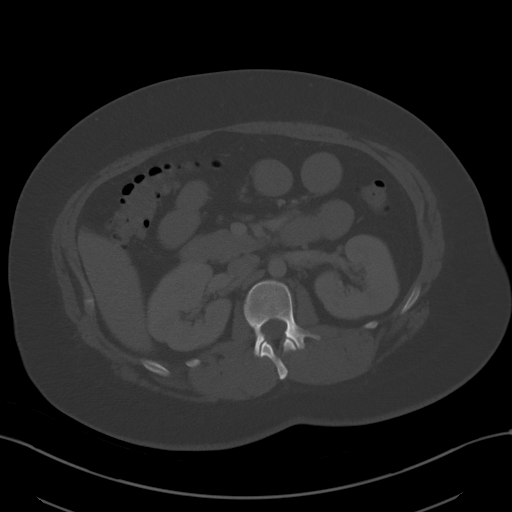
[im 65/92  soft-tissue]
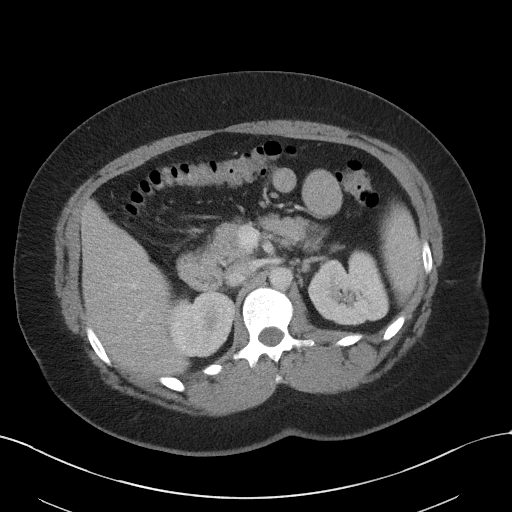
[im 73/92  soft-tissue]
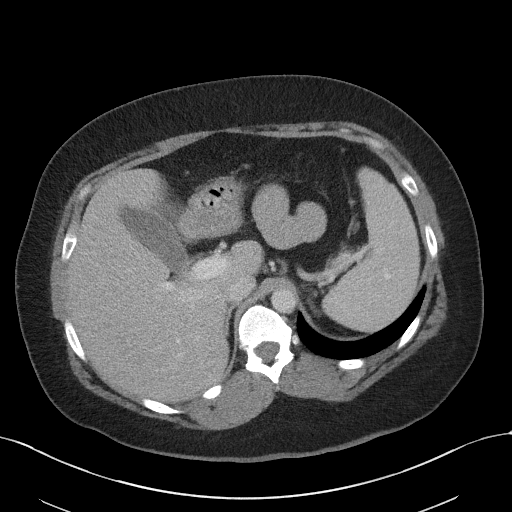
[im 80/92  soft-tissue]
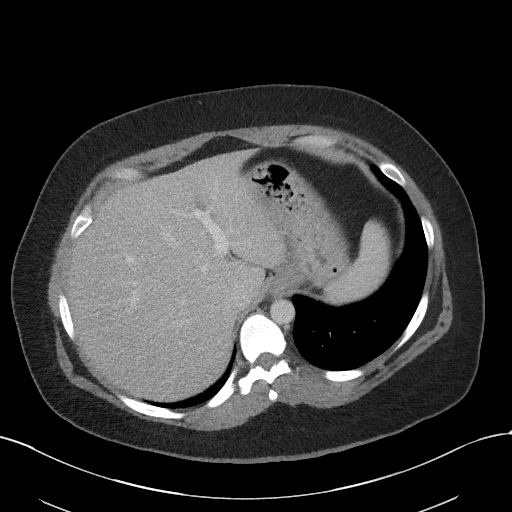
[im 88/92  soft-tissue]
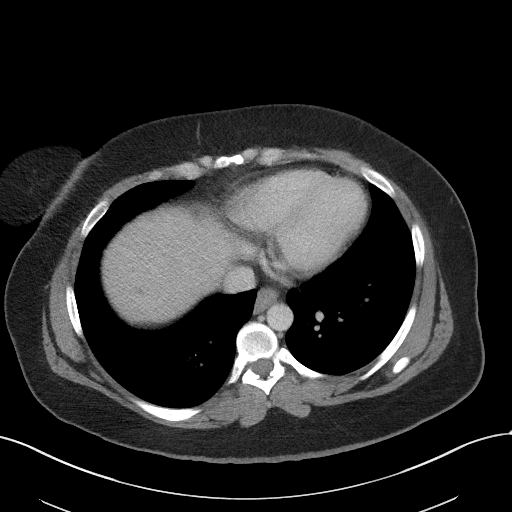

[Series 5: coronal st · coronal · 0.84mm/px · 3 of 93 slices shown]
[im 31/93  soft-tissue]
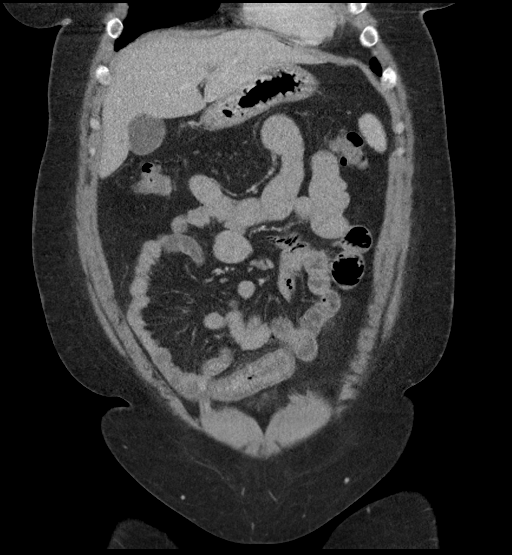
[im 41/93  soft-tissue]
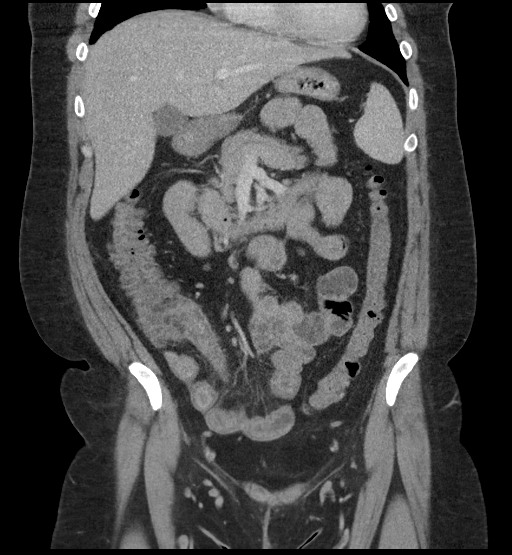
[im 52/93  soft-tissue]
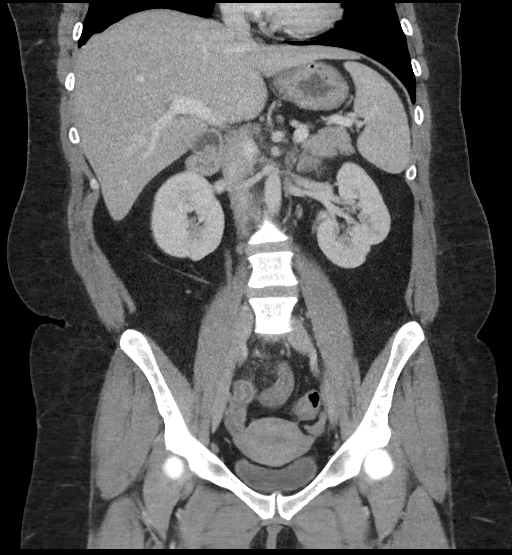

[16 of 46 positions shown; findings below may reference images not displayed]

FINDINGS: Lower chest: No acute abnormality.

Hepatobiliary: There is diffuse fatty infiltration of the liver
parenchyma. No focal liver abnormality is seen. No gallstones,
gallbladder wall thickening, or biliary dilatation.

Pancreas: Unremarkable. No pancreatic ductal dilatation or
surrounding inflammatory changes.

Spleen: Normal in size without focal abnormality.

Adrenals/Urinary Tract: Adrenal glands are unremarkable. Kidneys are
normal in size, without renal calculi or hydronephrosis. A
subcentimeter cystic appearing areas seen within the anteromedial
aspect of the mid right kidney. Bladder is unremarkable.

Stomach/Bowel: Stomach is within normal limits. Appendix appears
normal. No evidence of bowel dilatation mild thickening of the
distal and terminal ileum is seen with a mild amount of peri
intestinal inflammatory fat stranding.

Vascular/Lymphatic: No significant vascular findings are present. No
enlarged abdominal or pelvic lymph nodes.

Reproductive: Uterus and bilateral adnexa are unremarkable.

Other: No abdominal wall hernia or abnormality. A very small amount
of posterior pelvic fluid is seen.

Musculoskeletal: No acute or significant osseous findings.
IMPRESSION: 1. Mild thickening of the distal and terminal ileum with a mild
amount of peri intestinal inflammatory fat stranding, consistent
with an infectious or inflammatory enteritis.
2. Hepatic steatosis.

## 2022-04-05 ENCOUNTER — Emergency Department (HOSPITAL_BASED_OUTPATIENT_CLINIC_OR_DEPARTMENT_OTHER)
Admission: EM | Admit: 2022-04-05 | Discharge: 2022-04-05 | Disposition: A | Discharge status: Home / Self Care | Payer: Medicaid Other | Attending: Emergency Medicine | Admitting: Emergency Medicine

## 2022-04-05 ENCOUNTER — Encounter (HOSPITAL_BASED_OUTPATIENT_CLINIC_OR_DEPARTMENT_OTHER): Payer: Self-pay

## 2022-04-05 ENCOUNTER — Other Ambulatory Visit: Payer: Self-pay

## 2022-04-05 ENCOUNTER — Emergency Department (HOSPITAL_BASED_OUTPATIENT_CLINIC_OR_DEPARTMENT_OTHER): Payer: Medicaid Other

## 2022-04-05 ENCOUNTER — Other Ambulatory Visit (HOSPITAL_BASED_OUTPATIENT_CLINIC_OR_DEPARTMENT_OTHER): Payer: Self-pay

## 2022-04-05 DIAGNOSIS — N200 Calculus of kidney: Secondary | ICD-10-CM | POA: Insufficient documentation

## 2022-04-05 DIAGNOSIS — J45909 Unspecified asthma, uncomplicated: Secondary | ICD-10-CM | POA: Diagnosis not present

## 2022-04-05 DIAGNOSIS — R1032 Left lower quadrant pain: Secondary | ICD-10-CM | POA: Diagnosis present

## 2022-04-05 LAB — URINALYSIS, ROUTINE W REFLEX MICROSCOPIC
Bilirubin Urine: NEGATIVE
Glucose, UA: NEGATIVE mg/dL
Hgb urine dipstick: NEGATIVE
Ketones, ur: NEGATIVE mg/dL
Leukocytes,Ua: NEGATIVE
Nitrite: NEGATIVE
Protein, ur: NEGATIVE mg/dL
Specific Gravity, Urine: 1.006 (ref 1.005–1.030)
pH: 7.5 (ref 5.0–8.0)

## 2022-04-05 LAB — COMPREHENSIVE METABOLIC PANEL
ALT: 18 U/L (ref 0–44)
AST: 13 U/L — ABNORMAL LOW (ref 15–41)
Albumin: 4.8 g/dL (ref 3.5–5.0)
Alkaline Phosphatase: 70 U/L (ref 38–126)
Anion gap: 8 (ref 5–15)
BUN: 9 mg/dL (ref 6–20)
CO2: 26 mmol/L (ref 22–32)
Calcium: 8.9 mg/dL (ref 8.9–10.3)
Chloride: 104 mmol/L (ref 98–111)
Creatinine, Ser: 0.6 mg/dL (ref 0.44–1.00)
GFR, Estimated: >60 mL/min (ref >60)
Glucose, Bld: 100 mg/dL — ABNORMAL HIGH (ref 70–99)
Potassium: 3.8 mmol/L (ref 3.5–5.1)
Sodium: 138 mmol/L (ref 135–145)
Total Bilirubin: 0.8 mg/dL (ref 0.3–1.2)
Total Protein: 7.2 g/dL (ref 6.5–8.1)

## 2022-04-05 LAB — CBC
HCT: 40.4 % (ref 36.0–46.0)
Hemoglobin: 13.5 g/dL (ref 12.0–15.0)
MCH: 29.6 pg (ref 26.0–34.0)
MCHC: 33.4 g/dL (ref 30.0–36.0)
MCV: 88.6 fL (ref 80.0–100.0)
Platelets: 289 10*3/uL (ref 150–400)
RBC: 4.56 MIL/uL (ref 3.87–5.11)
RDW: 13.2 % (ref 11.5–15.5)
WBC: 8.3 10*3/uL (ref 4.0–10.5)
nRBC: 0 % (ref 0.0–0.2)

## 2022-04-05 LAB — PREGNANCY, URINE: Preg Test, Ur: NEGATIVE

## 2022-04-05 LAB — LIPASE, BLOOD: Lipase: <10 U/L — ABNORMAL LOW (ref 11–51)

## 2022-04-05 MED ORDER — MORPHINE SULFATE (PF) 4 MG/ML IV SOLN
4.0000 mg | Freq: Once | INTRAVENOUS | Status: AC
  Administered 2022-04-05: 4 mg via INTRAVENOUS
  Filled 2022-04-05: qty 1

## 2022-04-05 MED ORDER — ONDANSETRON HCL 4 MG/2ML IJ SOLN
4.0000 mg | Freq: Once | INTRAMUSCULAR | Status: AC
  Administered 2022-04-05: 4 mg via INTRAVENOUS
  Filled 2022-04-05: qty 2

## 2022-04-05 MED ORDER — ONDANSETRON HCL 4 MG PO TABS
4.0000 mg | ORAL_TABLET | Freq: Four times a day (QID) | ORAL | 0 refills | Status: DC
  Filled 2022-04-05: qty 12, 3d supply, fill #0

## 2022-04-05 MED ORDER — OXYCODONE HCL 5 MG PO TABS
5.0000 mg | ORAL_TABLET | ORAL | 0 refills | Status: DC | PRN
  Filled 2022-04-05: qty 30, 5d supply, fill #0

## 2022-04-05 MED ORDER — IOHEXOL 300 MG/ML  SOLN
100.0000 mL | Freq: Once | INTRAMUSCULAR | Status: AC | PRN
  Administered 2022-04-05: 80 mL via INTRAVENOUS

## 2022-04-05 MED ORDER — TAMSULOSIN HCL 0.4 MG PO CAPS
0.4000 mg | ORAL_CAPSULE | Freq: Every day | ORAL | 0 refills | Status: AC
  Filled 2022-04-05: qty 15, 15d supply, fill #0

## 2022-04-05 NOTE — ED Triage Notes (Signed)
Pt c/o LLQ pain that wraps from her flank since Thursday that has continued to worsen. Pt states she has hx of kidney stones.

## 2022-04-05 NOTE — Discharge Instructions (Addendum)
You were diagnosed today with kidney stones.  I recommend taking ibuprofen 600 mg 4 times daily.  I have also prescribed Zofran for nausea and Flomax..  Oxycodone has been prescribed for breakthrough pain.  Urology information is listed in the paperwork for follow-up if needed.  If you develop life-threatening conditions please return to the emergency department

## 2022-04-05 NOTE — ED Provider Notes (Signed)
[facility] EMERGENCY DEPT Provider Note   CSN: [id number] Arrival date & time: 04/05/22  [id number]     History  Chief Complaint  Patient presents with   Abdominal Pain   Flank Pain    Brandi Fleming is a 36 y.o. female.  Patient presents to the hospital complaining of left-sided abdominal pain and which began on Thursday.  Patient states that the pain began suddenly and has been increasing slowly in severity since that time.  She denies nausea or vomiting at this time.  Denies shortness of breath, chest pain.  She does endorse mild dysuria and foul-smelling urine.  Patient states she has a history of kidney stones which started in the same manner before with gradually building pain over the course of days, leading to nausea, vomiting, and severe pain.  Past medical history significant for bipolar 1 disorder, asthma.  HPI     Home Medications Prior to Admission medications   Medication Sig Start Date End Date Taking? Authorizing Provider  ondansetron (ZOFRAN) 4 MG tablet Take 1 tablet (4 mg total) by mouth every 6 (six) hours. 04/05/22  Yes Dorothyann Peng, PA-C  oxyCODONE (ROXICODONE) 5 MG immediate release tablet Take 1 tablet (5 mg total) by mouth every 4 (four) hours as needed for severe pain. 04/05/22  Yes Dorothyann Peng, PA-C  tamsulosin (FLOMAX) 0.4 MG CAPS capsule Take 1 capsule (0.4 mg total) by mouth daily for 15 days. 04/05/22 04/20/22 Yes Dorothyann Peng, PA-C  albuterol (VENTOLIN HFA) 108 (90 Base) MCG/ACT inhaler Inhale into the lungs every 6 (six) hours as needed for wheezing or shortness of breath.    [provider]  dicyclomine (BENTYL) 20 MG tablet Take 1 tablet (20 mg total) by mouth 2 (two) times daily. 02/16/20   Drenda Freeze, MD  DULoxetine (CYMBALTA) 60 MG capsule Take 60 mg by mouth daily.    [provider]  lidocaine (XYLOCAINE) 2 % solution Use as directed 15 mLs in the mouth or throat every 4 (four) hours as needed for  mouth pain. 01/29/21   Sherwood Gambler, MD      Allergies    Vicodin [hydrocodone-acetaminophen]    Review of Systems   Review of Systems  Constitutional:  Negative for fever.  Respiratory:  Negative for shortness of breath.   Cardiovascular:  Negative for chest pain.  Gastrointestinal:  Positive for abdominal pain. Negative for constipation, diarrhea, nausea and vomiting.  Genitourinary:  Positive for dysuria.    Physical Exam Updated Vital Signs BP 98/65   Pulse (!) 53   Temp 98.4 F (36.9 C) (Oral)   Resp 17   Ht 5\' 2"  (1.575 m)   Wt 90.7 kg   LMP 03/29/2022   SpO2 95%   BMI 36.58 kg/m  Physical Exam Vitals and nursing note reviewed.  Constitutional:      General: She is not in acute distress.    Appearance: She is well-developed.  HENT:     Head: Normocephalic and atraumatic.  Eyes:     Conjunctiva/sclera: Conjunctivae normal.  Cardiovascular:     Rate and Rhythm: Normal rate and regular rhythm.     Heart sounds: No murmur heard. Pulmonary:     Effort: Pulmonary effort is normal. No respiratory distress.     Breath sounds: Normal breath sounds.  Abdominal:     General: Abdomen is flat.     Palpations: Abdomen is soft.     Tenderness: There is abdominal tenderness in the  left lower quadrant. There is left CVA tenderness.  Musculoskeletal:        General: No swelling.     Cervical back: Neck supple.  Skin:    General: Skin is warm and dry.     Capillary Refill: Capillary refill takes less than 2 seconds.  Neurological:     Mental Status: She is alert.  Psychiatric:        Mood and Affect: Mood normal.     ED Results / Procedures / Treatments   Labs (all labs ordered are listed, but only abnormal results are displayed) Labs Reviewed  LIPASE, BLOOD - Abnormal; Notable for the following components:      Result Value   Lipase <10 (*)    All other components within normal limits  COMPREHENSIVE METABOLIC PANEL - Abnormal; Notable for the following  components:   Glucose, Bld 100 (*)    AST 13 (*)    All other components within normal limits  URINALYSIS, ROUTINE W REFLEX MICROSCOPIC - Abnormal; Notable for the following components:   Color, Urine COLORLESS (*)    All other components within normal limits  CBC  PREGNANCY, URINE    EKG None  Radiology CT Abdomen Pelvis W Contrast  Result Date: 04/05/2022 CLINICAL DATA:  Left lower quadrant abdominal pain. EXAM: CT ABDOMEN AND PELVIS WITH CONTRAST TECHNIQUE: Multidetector CT imaging of the abdomen and pelvis was performed using the standard protocol following bolus administration of intravenous contrast. RADIATION DOSE REDUCTION: This exam was performed according to the departmental dose-optimization program which includes automated exposure control, adjustment of the mA and/or kV according to patient size and/or use of iterative reconstruction technique. CONTRAST:  41mL OMNIPAQUE IOHEXOL 300 MG/ML  SOLN COMPARISON:  CT examination dated February 16, 2020 FINDINGS: Lower chest: No acute abnormality. Hepatobiliary: No focal liver abnormality is seen. No gallstones, gallbladder wall thickening, or biliary dilatation. Pancreas: Unremarkable. No pancreatic ductal dilatation or surrounding inflammatory changes. Spleen: Normal in size without focal abnormality. Adrenals/Urinary Tract: Adrenal glands are unremarkable. There are 2 punctate 2-3 mm calculi in the upper pole of the left kidney. Evidence of hydronephrosis or ureteral calculus. Small subcentimeter cyst in the upper pole of the right kidney. Bladder is unremarkable. Stomach/Bowel: Stomach is within normal limits. Appendix appears normal. No evidence of bowel wall thickening, distention, or inflammatory changes. Vascular/Lymphatic: No significant vascular findings are present. No enlarged abdominal or pelvic lymph nodes. Reproductive: Uterus and bilateral adnexa are unremarkable. Other: No abdominal wall hernia or abnormality. No abdominopelvic  ascites. Musculoskeletal: No acute or significant osseous findings. IMPRESSION: 1. Two punctate nonobstructing calculi in the upper pole of the left kidney without evidence of hydronephrosis or ureteral calculus. 2. Bowel loops are normal in caliber. No evidence of colitis or diverticulitis. Normal appendix. 3. Uterus and adnexa are unremarkable. 4. No CT evidence of acute abdominal/pelvic process. Electronically Signed   By: Keane Police D.O.   On: 04/05/2022 11:20    Procedures Procedures    Medications Ordered in ED Medications  morphine (PF) 4 MG/ML injection 4 mg (4 mg Intravenous Given 04/05/22 0934)  ondansetron (ZOFRAN) injection 4 mg (4 mg Intravenous Given 04/05/22 0934)  iohexol (OMNIPAQUE) 300 MG/ML solution 100 mL (80 mLs Intravenous Contrast Given 04/05/22 1100)  morphine (PF) 4 MG/ML injection 4 mg (4 mg Intravenous Given 04/05/22 1126)    ED Course/ Medical Decision Making/ A&P  Medical Decision Making Amount and/or Complexity of Data Reviewed Labs: ordered. Radiology: ordered.  Risk Prescription drug management.   This patient presents to the ED for concern of left lower quadrant abdominal pain and flank pain, this involves an extensive number of treatment options, and is a complaint that carries with it a high risk of complications and morbidity.  The differential diagnosis includes nephrolithiasis, pyelonephritis, diverticulitis colitis, urinary tract infection, others   Co morbidities that complicate the patient evaluation  History of kidney stones, bipolar 1 disorder   Additional history obtained:   External records from outside source obtained and reviewed including visit for kidney stone 2021   Lab Tests:  I Ordered, and personally interpreted labs.  The pertinent results include: Lipase less than 10, negative pregnancy test, unremarkable UA, CMP, CBC   Imaging Studies ordered:  I ordered imaging studies including CT abdomen  pelvis with contrast I independently visualized and interpreted imaging which showed  1. Two punctate nonobstructing calculi in the upper pole of the left  kidney without evidence of hydronephrosis or ureteral calculus.  2. Bowel loops are normal in caliber. No evidence of colitis or  diverticulitis. Normal appendix.  3. Uterus and adnexa are unremarkable.  4. No CT evidence of acute abdominal/pelvic process   I agree with the radiologist interpretation    Problem List / ED Course / Critical interventions / Medication management   I ordered medication including morphine for pain, Zofran for nausea Reevaluation of the patient after these medicines showed that the patient improved I have reviewed the patients home medicines and have made adjustments as needed   Social Determinants of Health:  Patient has no health insurance   Test / Admission - Considered:  The patient has been found to have 2 small punctate, nonobstructing calculi in the upper pole the left kidney.  She may have passed a stone as well.  I feel this is most likely the source of her pain.  No other signs of acute abdominal process or pelvic process on CT.  Lipase is normal patient is not pregnant.  Plan to discharge patient at this time pain medication, Zofran, and Flomax.  Urology follow-up information provided in case the patient continues to have issues.  Discharge home        Final Clinical Impression(s) / ED Diagnoses Final diagnoses:  Nephrolithiasis    Rx / DC Orders ED Discharge Orders          Ordered    ondansetron (ZOFRAN) 4 MG tablet  Every 6 hours        04/05/22 1216    tamsulosin (FLOMAX) 0.4 MG CAPS capsule  Daily        04/05/22 1216    oxyCODONE (ROXICODONE) 5 MG immediate release tablet  Every 4 hours PRN        04/05/22 1216              Ronny Bacon 04/05/22 1216    Ezequiel Essex, MD 04/05/22 [phone]

## 2022-05-05 ENCOUNTER — Other Ambulatory Visit: Payer: Self-pay

## 2022-05-05 ENCOUNTER — Encounter (HOSPITAL_BASED_OUTPATIENT_CLINIC_OR_DEPARTMENT_OTHER): Payer: Self-pay

## 2022-05-05 ENCOUNTER — Emergency Department (HOSPITAL_BASED_OUTPATIENT_CLINIC_OR_DEPARTMENT_OTHER)
Admission: EM | Admit: 2022-05-05 | Discharge: 2022-05-05 | Disposition: A | Discharge status: Home / Self Care | Payer: Medicaid Other | Attending: Emergency Medicine | Admitting: Emergency Medicine

## 2022-05-05 ENCOUNTER — Emergency Department (HOSPITAL_BASED_OUTPATIENT_CLINIC_OR_DEPARTMENT_OTHER): Payer: Medicaid Other

## 2022-05-05 DIAGNOSIS — K6289 Other specified diseases of anus and rectum: Secondary | ICD-10-CM | POA: Diagnosis not present

## 2022-05-05 DIAGNOSIS — Z87891 Personal history of nicotine dependence: Secondary | ICD-10-CM | POA: Diagnosis not present

## 2022-05-05 DIAGNOSIS — J45909 Unspecified asthma, uncomplicated: Secondary | ICD-10-CM | POA: Insufficient documentation

## 2022-05-05 LAB — COMPREHENSIVE METABOLIC PANEL
ALT: 12 U/L (ref 0–44)
AST: 11 U/L — ABNORMAL LOW (ref 15–41)
Albumin: 4.2 g/dL (ref 3.5–5.0)
Alkaline Phosphatase: 61 U/L (ref 38–126)
Anion gap: 6 (ref 5–15)
BUN: 10 mg/dL (ref 6–20)
CO2: 26 mmol/L (ref 22–32)
Calcium: 9.5 mg/dL (ref 8.9–10.3)
Chloride: 106 mmol/L (ref 98–111)
Creatinine, Ser: 0.6 mg/dL (ref 0.44–1.00)
GFR, Estimated: >60 mL/min (ref >60)
Glucose, Bld: 111 mg/dL — ABNORMAL HIGH (ref 70–99)
Potassium: 3.5 mmol/L (ref 3.5–5.1)
Sodium: 138 mmol/L (ref 135–145)
Total Bilirubin: 0.7 mg/dL (ref 0.3–1.2)
Total Protein: 6.4 g/dL — ABNORMAL LOW (ref 6.5–8.1)

## 2022-05-05 LAB — HCG, SERUM, QUALITATIVE: Preg, Serum: NEGATIVE

## 2022-05-05 LAB — LIPASE, BLOOD: Lipase: 12 U/L (ref 11–51)

## 2022-05-05 LAB — CBC
HCT: 38 % (ref 36.0–46.0)
Hemoglobin: 12.9 g/dL (ref 12.0–15.0)
MCH: 29.8 pg (ref 26.0–34.0)
MCHC: 33.9 g/dL (ref 30.0–36.0)
MCV: 87.8 fL (ref 80.0–100.0)
Platelets: 256 10*3/uL (ref 150–400)
RBC: 4.33 MIL/uL (ref 3.87–5.11)
RDW: 13.4 % (ref 11.5–15.5)
WBC: 8.1 10*3/uL (ref 4.0–10.5)
nRBC: 0 % (ref 0.0–0.2)

## 2022-05-05 MED ORDER — IOHEXOL 300 MG/ML  SOLN
100.0000 mL | Freq: Once | INTRAMUSCULAR | Status: AC | PRN
  Administered 2022-05-05: 80 mL via INTRAVENOUS

## 2022-05-05 MED ORDER — HYDROCORTISONE (PERIANAL) 2.5 % EX CREA: 1.0000 | TOPICAL_CREAM | Freq: Two times a day (BID) | CUTANEOUS | 0 refills | Status: DC

## 2022-05-05 MED ORDER — KETOROLAC TROMETHAMINE 15 MG/ML IJ SOLN
15.0000 mg | Freq: Once | INTRAMUSCULAR | Status: AC
  Administered 2022-05-05: 15 mg via INTRAVENOUS
  Filled 2022-05-05: qty 1

## 2022-05-05 MED ORDER — SODIUM CHLORIDE 0.9 % IV BOLUS
1000.0000 mL | Freq: Once | INTRAVENOUS | Status: AC
  Administered 2022-05-05: 1000 mL via INTRAVENOUS

## 2022-05-05 MED ORDER — HYDROMORPHONE HCL 1 MG/ML IJ SOLN
0.5000 mg | Freq: Once | INTRAMUSCULAR | Status: AC
  Administered 2022-05-05: 0.5 mg via INTRAVENOUS
  Filled 2022-05-05: qty 1

## 2022-05-05 MED ORDER — BISACODYL 10 MG RE SUPP: 10.0000 mg | RECTAL | 0 refills | Status: DC | PRN

## 2022-05-05 NOTE — ED Provider Notes (Signed)
[facility]ERGENCY [facility] Emergency Department Provider Note MRN:  [id number]  Arrival date & time: 05/05/22     Chief Complaint   Rectal Pain   History of Present Illness   Brandi Fleming is a 36 y.o. year-old female with a history of bipolar disorder presenting to the ED with chief complaint of rectal pain.  Patient explains that she has dealt with hemorrhoids for the past 15 years.  She was pushing when trying to have a bowel movement earlier and then felt significant pain in her rectum, was unable to pass any bowel movement.  Having continued pain in the rectum internally, has never had to come to the emergency department for her hemorrhoids.  Feels a bit constipated/bloated.  Otherwise no fever, no other concerns.  Review of Systems  A thorough review of systems was obtained and all systems are negative except as noted in the HPI and PMH.   Patient's Health History    Past Medical History:  Diagnosis Date   Asthma    Bipolar 1 disorder (HCC)     Past Surgical History:  Procedure Laterality Date   WISDOM TOOTH EXTRACTION      No family history on file.  Social History   Socioeconomic History   Marital status: Single    Spouse name: Not on file   Number of children: Not on file   Years of education: Not on file   Highest education level: Not on file  Occupational History   Not on file  Tobacco Use   Smoking status: Former   Smokeless tobacco: Never  Vaping Use   Vaping Use: Never used  Substance and Sexual Activity   Alcohol use: Yes    Comment: occasionally   Drug use: Yes    Types: Marijuana   Sexual activity: Yes  Other Topics Concern   Not on file  Social History Narrative   Not on file   Social Determinants of Health   Financial Resource Strain: Not on file  Food Insecurity: Not on file  Transportation Needs: Not on file  Physical Activity: Not on file  Stress: Not on file  Social Connections: Not on file  Intimate Partner Violence:  Not on file     Physical Exam   Vitals:   05/05/22 0500 05/05/22 0615  BP:  (!) 122/91  Pulse: 77 (!) 59  Resp: 15 16  Temp:    SpO2: 100% 100%    CONSTITUTIONAL: Well-appearing, NAD NEURO/PSYCH:  Alert and oriented x 3, no focal deficits EYES:  eyes equal and reactive ENT/NECK:  no LAD, no JVD CARDIO: Regular rate, well-perfused, normal S1 and S2 PULM:  CTAB no wheezing or rhonchi GI/GU:  non-distended, non-tender MSK/SPINE:  No gross deformities, no edema SKIN:  no rash, atraumatic   *Additional and/or pertinent findings included in MDM below  Diagnostic and Interventional Summary    EKG Interpretation  Date/Time:    Ventricular Rate:    PR Interval:    QRS Duration:   QT Interval:    QTC Calculation:   R Axis:     Text Interpretation:         Labs Reviewed  COMPREHENSIVE METABOLIC PANEL - Abnormal; Notable for the following components:      Result Value   Glucose, Bld 111 (*)    Total Protein 6.4 (*)    AST 11 (*)    All other components within normal limits  CBC  LIPASE, BLOOD  HCG, SERUM, QUALITATIVE  CT ABDOMEN PELVIS W CONTRAST  Final Result      Medications  sodium chloride 0.9 % bolus 1,000 mL (1,000 mLs Intravenous New Bag/Given 05/05/22 0455)  ketorolac (TORADOL) 15 MG/ML injection 15 mg (15 mg Intravenous Given 05/05/22 0454)  HYDROmorphone (DILAUDID) injection 0.5 mg (0.5 mg Intravenous Given 05/05/22 0454)  iohexol (OMNIPAQUE) 300 MG/ML solution 100 mL (80 mLs Intravenous Contrast Given 05/05/22 0558)     Procedures  /  Critical Care Procedures  ED Course and Medical Decision Making  Initial Impression and Ddx Normal vital signs and well-appearing, having a lot of of rectal pain and tenderness, both superficially and deeper pain.  On exam she has several small nonthrombosed hemorrhoids that seem largely benign.  No signs of anal fissure, there is no evidence of perianal abscess, overall exam reveals no explanation for the amount of  pain she is having.  And so differential diagnosis includes perirectal abscess, colonic mass.  Awaiting labs and CT.  Past medical/surgical history that increases complexity of ED encounter: Hemorrhoids  Interpretation of Diagnostics I personally reviewed the laboratory assessment and my interpretation is as follows: No significant blood count or electrolyte disturbance  CT is unremarkable.  Patient Reassessment and Ultimate Disposition/Management     Patient feeling better, no emergent process, appropriate for discharge.  Patient management required discussion with the following services or consulting groups:  None  Complexity of Problems Addressed Acute illness or injury that poses threat of life of bodily function  Additional Data Reviewed and Analyzed Further history obtained from: Prior labs/imaging results  Additional Factors Impacting ED Encounter Risk Prescriptions  Barth Kirks. Sedonia Small, MD [place] [email or web address]  Final Clinical Impressions(s) / ED Diagnoses     ICD-10-CM   1. Anal or rectal pain  K62.89       ED Discharge Orders          Ordered    bisacodyl (DULCOLAX) 10 MG suppository  As needed        05/05/22 0643    hydrocortisone (ANUSOL-HC) 2.5 % rectal cream  2 times daily        05/05/22 8144             Discharge Instructions Discussed with and Provided to Patient:     Discharge Instructions      You were evaluated in the Emergency Department and after careful evaluation, we did not find any emergent condition requiring admission or further testing in the hospital.  Your exam/testing today was overall reassuring.  Symptoms seem to be due to hemorrhoids.  Your CT did not show any other concerning findings.  Recommend continued use of your numbing cream at home as needed.  Also recommend using the steroid cream provided.  Use either over-the-counter MiraLAX or the suppositories to prevent  or treat any constipation.  Recommend follow-up with the general surgeons if you continue to have hemorrhoid problems.  Please return to the Emergency Department if you experience any worsening of your condition.  Thank you for allowing [place] to be a part of your care.        Maudie Flakes, MD 05/05/22 [phone]

## 2022-05-05 NOTE — Discharge Instructions (Addendum)
You were evaluated in the Emergency Department and after careful evaluation, we did not find any emergent condition requiring admission or further testing in the hospital.  Your exam/testing today was overall reassuring.  Symptoms seem to be due to hemorrhoids.  Your CT did not show any other concerning findings.  Recommend continued use of your numbing cream at home as needed.  Also recommend using the steroid cream provided.  Use either over-the-counter MiraLAX or the suppositories to prevent or treat any constipation.  Recommend follow-up with the general surgeons if you continue to have hemorrhoid problems.  Please return to the Emergency Department if you experience any worsening of your condition.  Thank you for allowing [place] to be a part of your care.

## 2022-05-05 NOTE — ED Triage Notes (Signed)
Rectal pain started about 24 hours ago. States this pain is different that her normal hemorrhoid pain. State's she is having some rectal bleeding.

## 2022-05-05 NOTE — ED Notes (Signed)
Patient transported to CT 

## 2022-06-18 DIAGNOSIS — Z1159 Encounter for screening for other viral diseases: Secondary | ICD-10-CM | POA: Diagnosis not present

## 2022-06-18 DIAGNOSIS — Z6836 Body mass index (BMI) 36.0-36.9, adult: Secondary | ICD-10-CM | POA: Diagnosis not present

## 2022-06-18 DIAGNOSIS — R5383 Other fatigue: Secondary | ICD-10-CM | POA: Diagnosis not present

## 2022-06-18 DIAGNOSIS — E559 Vitamin D deficiency, unspecified: Secondary | ICD-10-CM | POA: Diagnosis not present

## 2022-06-18 DIAGNOSIS — Z0000 Encounter for general adult medical examination without abnormal findings: Secondary | ICD-10-CM | POA: Diagnosis not present

## 2022-06-18 DIAGNOSIS — Z3200 Encounter for pregnancy test, result unknown: Secondary | ICD-10-CM | POA: Diagnosis not present

## 2022-06-25 DIAGNOSIS — F901 Attention-deficit hyperactivity disorder, predominantly hyperactive type: Secondary | ICD-10-CM | POA: Diagnosis not present

## 2022-06-25 DIAGNOSIS — F419 Anxiety disorder, unspecified: Secondary | ICD-10-CM | POA: Diagnosis not present

## 2022-06-25 DIAGNOSIS — K649 Unspecified hemorrhoids: Secondary | ICD-10-CM | POA: Diagnosis not present

## 2022-06-25 DIAGNOSIS — Z79899 Other long term (current) drug therapy: Secondary | ICD-10-CM | POA: Diagnosis not present

## 2022-06-25 DIAGNOSIS — Z3200 Encounter for pregnancy test, result unknown: Secondary | ICD-10-CM | POA: Diagnosis not present

## 2022-06-25 DIAGNOSIS — F321 Major depressive disorder, single episode, moderate: Secondary | ICD-10-CM | POA: Diagnosis not present

## 2022-06-25 DIAGNOSIS — Z6836 Body mass index (BMI) 36.0-36.9, adult: Secondary | ICD-10-CM | POA: Diagnosis not present

## 2022-06-25 DIAGNOSIS — F3160 Bipolar disorder, current episode mixed, unspecified: Secondary | ICD-10-CM | POA: Diagnosis not present

## 2022-06-25 DIAGNOSIS — E559 Vitamin D deficiency, unspecified: Secondary | ICD-10-CM | POA: Diagnosis not present

## 2022-06-28 DIAGNOSIS — R197 Diarrhea, unspecified: Secondary | ICD-10-CM | POA: Diagnosis not present

## 2022-06-30 DIAGNOSIS — Z79899 Other long term (current) drug therapy: Secondary | ICD-10-CM | POA: Diagnosis not present

## 2022-07-01 DIAGNOSIS — Z79899 Other long term (current) drug therapy: Secondary | ICD-10-CM | POA: Diagnosis not present

## 2022-07-01 DIAGNOSIS — F901 Attention-deficit hyperactivity disorder, predominantly hyperactive type: Secondary | ICD-10-CM | POA: Diagnosis not present

## 2022-07-01 DIAGNOSIS — Z6836 Body mass index (BMI) 36.0-36.9, adult: Secondary | ICD-10-CM | POA: Diagnosis not present

## 2022-07-01 DIAGNOSIS — Z3200 Encounter for pregnancy test, result unknown: Secondary | ICD-10-CM | POA: Diagnosis not present

## 2022-07-01 DIAGNOSIS — F3160 Bipolar disorder, current episode mixed, unspecified: Secondary | ICD-10-CM | POA: Diagnosis not present

## 2022-07-06 DIAGNOSIS — Z79899 Other long term (current) drug therapy: Secondary | ICD-10-CM | POA: Diagnosis not present

## 2022-07-12 HISTORY — PX: HEMORRHOID BANDING: SHX5850

## 2022-07-28 ENCOUNTER — Encounter (HOSPITAL_BASED_OUTPATIENT_CLINIC_OR_DEPARTMENT_OTHER): Payer: Self-pay | Admitting: Emergency Medicine

## 2022-07-28 ENCOUNTER — Emergency Department (HOSPITAL_BASED_OUTPATIENT_CLINIC_OR_DEPARTMENT_OTHER)
Admission: EM | Admit: 2022-07-28 | Discharge: 2022-07-28 | Disposition: A | Discharge status: Home / Self Care | Payer: Medicaid Other | Attending: Emergency Medicine | Admitting: Emergency Medicine

## 2022-07-28 ENCOUNTER — Emergency Department (HOSPITAL_BASED_OUTPATIENT_CLINIC_OR_DEPARTMENT_OTHER): Payer: Medicaid Other

## 2022-07-28 ENCOUNTER — Other Ambulatory Visit: Payer: Self-pay

## 2022-07-28 DIAGNOSIS — K644 Residual hemorrhoidal skin tags: Secondary | ICD-10-CM | POA: Diagnosis not present

## 2022-07-28 DIAGNOSIS — K6289 Other specified diseases of anus and rectum: Secondary | ICD-10-CM | POA: Diagnosis not present

## 2022-07-28 DIAGNOSIS — J45909 Unspecified asthma, uncomplicated: Secondary | ICD-10-CM | POA: Insufficient documentation

## 2022-07-28 DIAGNOSIS — Z87891 Personal history of nicotine dependence: Secondary | ICD-10-CM | POA: Insufficient documentation

## 2022-07-28 LAB — CBC WITH DIFFERENTIAL/PLATELET
Abs Immature Granulocytes: 0.02 10*3/uL (ref 0.00–0.07)
Basophils Absolute: 0.1 10*3/uL (ref 0.0–0.1)
Basophils Relative: 1 %
Eosinophils Absolute: 0.2 10*3/uL (ref 0.0–0.5)
Eosinophils Relative: 3 %
HCT: 39.4 % (ref 36.0–46.0)
Hemoglobin: 13.1 g/dL (ref 12.0–15.0)
Immature Granulocytes: 0 %
Lymphocytes Relative: 27 %
Lymphs Abs: 2.2 10*3/uL (ref 0.7–4.0)
MCH: 29.8 pg (ref 26.0–34.0)
MCHC: 33.2 g/dL (ref 30.0–36.0)
MCV: 89.7 fL (ref 80.0–100.0)
Monocytes Absolute: 0.5 10*3/uL (ref 0.1–1.0)
Monocytes Relative: 6 %
Neutro Abs: 5.2 10*3/uL (ref 1.7–7.7)
Neutrophils Relative %: 63 %
Platelets: 220 10*3/uL (ref 150–400)
RBC: 4.39 MIL/uL (ref 3.87–5.11)
RDW: 13.3 % (ref 11.5–15.5)
WBC: 8.1 10*3/uL (ref 4.0–10.5)
nRBC: 0 % (ref 0.0–0.2)

## 2022-07-28 LAB — COMPREHENSIVE METABOLIC PANEL
ALT: 24 U/L (ref 0–44)
AST: 13 U/L — ABNORMAL LOW (ref 15–41)
Albumin: 4 g/dL (ref 3.5–5.0)
Alkaline Phosphatase: 72 U/L (ref 38–126)
Anion gap: 7 (ref 5–15)
BUN: 9 mg/dL (ref 6–20)
CO2: 25 mmol/L (ref 22–32)
Calcium: 9 mg/dL (ref 8.9–10.3)
Chloride: 106 mmol/L (ref 98–111)
Creatinine, Ser: 0.53 mg/dL (ref 0.44–1.00)
GFR, Estimated: >60 mL/min (ref >60)
Glucose, Bld: 103 mg/dL — ABNORMAL HIGH (ref 70–99)
Potassium: 4.2 mmol/L (ref 3.5–5.1)
Sodium: 138 mmol/L (ref 135–145)
Total Bilirubin: 0.6 mg/dL (ref 0.3–1.2)
Total Protein: 6.5 g/dL (ref 6.5–8.1)

## 2022-07-28 LAB — PREGNANCY, URINE: Preg Test, Ur: NEGATIVE

## 2022-07-28 MED ORDER — ONDANSETRON 4 MG PO TBDP
4.0000 mg | ORAL_TABLET | Freq: Once | ORAL | Status: AC
  Administered 2022-07-28: 4 mg via ORAL
  Filled 2022-07-28: qty 1

## 2022-07-28 MED ORDER — OXYCODONE HCL 5 MG PO TABS: 5.0000 mg | ORAL_TABLET | Freq: Four times a day (QID) | ORAL | 0 refills | Status: DC | PRN

## 2022-07-28 MED ORDER — IOHEXOL 300 MG/ML  SOLN
100.0000 mL | Freq: Once | INTRAMUSCULAR | Status: AC | PRN
  Administered 2022-07-28: 80 mL via INTRAVENOUS

## 2022-07-28 MED ORDER — OXYCODONE-ACETAMINOPHEN 5-325 MG PO TABS
1.0000 | ORAL_TABLET | Freq: Once | ORAL | Status: AC
  Administered 2022-07-28: 1 via ORAL
  Filled 2022-07-28: qty 1

## 2022-07-28 MED ORDER — DIPHENHYDRAMINE HCL 25 MG PO CAPS
25.0000 mg | ORAL_CAPSULE | Freq: Once | ORAL | Status: AC
  Administered 2022-07-28: 25 mg via ORAL
  Filled 2022-07-28: qty 1

## 2022-07-28 NOTE — ED Provider Notes (Signed)
[place] EMERGENCY DEPT Provider Note   CSN: [id number] Arrival date & time: 07/28/22  7829     History  Chief Complaint  Patient presents with   Post-op Problem    Brandi Fleming is a 37 y.o. female.  HPI   37 year old female presents emergency department with complaints of rectal pain.  Patient with extensive history of internal and external hemorrhoids over the past 15 years.  Reports having banding procedure performed this past Thursday through [facility].  Has noted persistently worsening rectal pain since then.  States that she has noted bright red blood per rectum every time she had a bowel movement since the procedure.  She called her surgeon earlier this morning who told her to come the emergency department for further evaluation.  States that she has been taking tramadol as well as numbing topical agent of which has helped some.  Reports associated nausea with 1 episode of emesis earlier this morning secondary to increased pain.  Patient states that she has been unable to keep her bowel movements soft and has had increased straining since surgery.  Requesting medicine to help with more regular bowel movements.  Denies fever, chills, night sweats, chest pain, shortness of breath, abdominal pain.  Past medical history significant for asthma, bipolar 1 disorder  Home Medications Prior to Admission medications   Medication Sig Start Date End Date Taking? Authorizing Provider  albuterol (VENTOLIN HFA) 108 (90 Base) MCG/ACT inhaler Inhale into the lungs every 6 (six) hours as needed for wheezing or shortness of breath.    [provider]  amphetamine-dextroamphetamine (ADDERALL XR) 10 MG 24 hr capsule Take 10 mg by mouth daily. 07/01/22   [provider]  amphetamine-dextroamphetamine (ADDERALL XR) 15 MG 24 hr capsule Take 15 mg by mouth daily. 07/19/22   [provider]  amphetamine-dextroamphetamine (ADDERALL) 5 MG tablet Take 5 mg  by mouth. 06/28/22   [provider]  bisacodyl (DULCOLAX) 10 MG suppository Place 1 suppository (10 mg total) rectally as needed for moderate constipation. 05/05/22   Maudie Flakes, MD  CORTIFOAM 10 % FOAM Place 1 applicator rectally 2 (two) times daily. 07/21/22   [provider]  dicyclomine (BENTYL) 20 MG tablet Take 1 tablet (20 mg total) by mouth 2 (two) times daily. 02/16/20   Drenda Freeze, MD  doxycycline (MONODOX) 100 MG capsule Take 100 mg by mouth 2 (two) times daily. 07/16/22   [provider]  DULoxetine (CYMBALTA) 60 MG capsule Take 60 mg by mouth daily.    [provider]  hydrocortisone (ANUSOL-HC) 2.5 % rectal cream Place 1 Application rectally 2 (two) times daily. 05/05/22   Maudie Flakes, MD  hydrOXYzine (ATARAX) 50 MG tablet Take 50 mg by mouth at bedtime. 06/18/22   [provider]  lidocaine (XYLOCAINE) 2 % solution Use as directed 15 mLs in the mouth or throat every 4 (four) hours as needed for mouth pain. 01/29/21   Sherwood Gambler, MD  ondansetron (ZOFRAN) 4 MG tablet Take 1 tablet (4 mg total) by mouth every 6 (six) hours. 04/05/22   Dorothyann Peng, PA-C  oxyCODONE (ROXICODONE) 5 MG immediate release tablet Take 1 tablet (5 mg total) by mouth every 4 (four) hours as needed for severe pain. 04/05/22   Dorothyann Peng, PA-C  QUEtiapine (SEROQUEL XR) 400 MG 24 hr tablet Take 400 mg by mouth every evening. 07/19/22   [provider]  traMADol (ULTRAM) 50 MG tablet Take 50 mg  by mouth 4 (four) times daily as needed. 07/22/22   [provider]      Allergies    Vicodin [hydrocodone-acetaminophen]    Review of Systems   Review of Systems  All other systems reviewed and are negative.   Physical Exam Updated Vital Signs BP 121/84 (BP Location: Right Arm)   Pulse 78   Temp 97.8 F (36.6 C) (Oral)   Resp 16   Ht 5\' 2"  (1.575 m)   Wt 90.7 kg   SpO2 99%   BMI 36.58 kg/m  Physical Exam Vitals and nursing  note reviewed. Exam conducted with a chaperone present.  Constitutional:      General: She is not in acute distress.    Appearance: She is well-developed.  HENT:     Head: Normocephalic and atraumatic.  Eyes:     Conjunctiva/sclera: Conjunctivae normal.  Cardiovascular:     Rate and Rhythm: Normal rate and regular rhythm.     Heart sounds: No murmur heard. Pulmonary:     Effort: Pulmonary effort is normal. No respiratory distress.     Comments: Mild wheeze auscultated Abdominal:     Palpations: Abdomen is soft.     Tenderness: There is no abdominal tenderness.  Genitourinary:    Comments: External rectal exam performed showed nonthrombosed external hemorrhoids.  Internal exam not performed secondary to prior banding procedure.  No active bleeding appreciated.  No obvious overlying skin abnormalities appreciated. Musculoskeletal:        General: No swelling.     Cervical back: Neck supple.  Skin:    General: Skin is warm and dry.     Capillary Refill: Capillary refill takes less than 2 seconds.  Neurological:     Mental Status: She is alert.  Psychiatric:        Mood and Affect: Mood normal.     ED Results / Procedures / Treatments   Labs (all labs ordered are listed, but only abnormal results are displayed) Labs Reviewed  COMPREHENSIVE METABOLIC PANEL - Abnormal; Notable for the following components:      Result Value   Glucose, Bld 103 (*)    AST 13 (*)    All other components within normal limits  CBC WITH DIFFERENTIAL/PLATELET  PREGNANCY, URINE    EKG None  Radiology CT Abdomen Pelvis W Contrast  Result Date: 07/28/2022 CLINICAL DATA:  Hemorrhoidal banding surgery 6 days ago, increased pain. EXAM: CT ABDOMEN AND PELVIS WITH CONTRAST TECHNIQUE: Multidetector CT imaging of the abdomen and pelvis was performed using the standard protocol following bolus administration of intravenous contrast. RADIATION DOSE REDUCTION: This exam was performed according to the  departmental dose-optimization program which includes automated exposure control, adjustment of the mA and/or kV according to patient size and/or use of iterative reconstruction technique. CONTRAST:  59mL OMNIPAQUE IOHEXOL 300 MG/ML  SOLN COMPARISON:  05/05/2022 FINDINGS: Lower chest: Unremarkable Hepatobiliary: Stable transient hepatic attenuation difference posteriorly in the right hepatic lobe on image 22 series 2, potentially indicating the presence of an underlying flash filling hemangioma or vascular malformation, not substantially changed over the last several years. Similarly there is a subtle focus of enhancement in the dome of the right hepatic lobe on image 54 of series 5 which is chronically stable and probably a small flash filling hemangioma. Gallbladder unremarkable. Pancreas: Unremarkable Spleen: Unremarkable Adrenals/Urinary Tract: Two punctate left kidney upper pole nonobstructive renal calculi are present, each about 0.2 cm in size as shown on images 40-41 of series 5. Otherwise unremarkable.  Stomach/Bowel: No substantial abnormality is identified in the perirectal space a or ischiorectal fossa. Potential mild wall thickening along the anus on images 93-96 of series 2. No compelling findings of perianal fistula although dedicated MRI is more sensitive in detecting such. There is no perianal abscess observed on CT. The remainder of the bowel is unremarkable including the appendix. Vascular/Lymphatic: Unremarkable Reproductive: Unremarkable Other: No supplemental non-categorized findings. Musculoskeletal: Unremarkable IMPRESSION: 1. Mild anal wall thickening is likely inflammatory; no compelling findings of perianal fistula or abscess. 2. Two punctate left kidney upper pole nonobstructive renal calculi. 3. Stable transient hepatic attenuation difference posteriorly in the right hepatic lobe, potentially indicating the presence of an underlying flash filling hemangioma or vascular malformation, not  substantially changed over the last several years. Similarly there is a subtle focus of enhancement in the dome of the right hepatic lobe which is chronically stable and probably a small flash filling hemangioma. Chronic stability strongly implies benign etiology; in the absence of abnormal liver enzymes or personal history of gastrointestinal malignancy, these are not felt to require additional follow up. If additional follow up is elected, hepatic protocol MRI with and without contrast would be the most suitable imaging examination for specific characterization. Electronically Signed   By: Van Clines M.D.   On: 07/28/2022 11:10    Procedures Procedures    Medications Ordered in ED Medications  oxyCODONE-acetaminophen (PERCOCET/ROXICET) 5-325 MG per tablet 1 tablet (1 tablet Oral Given 07/28/22 0943)  diphenhydrAMINE (BENADRYL) capsule 25 mg (25 mg Oral Given 07/28/22 0943)  ondansetron (ZOFRAN-ODT) disintegrating tablet 4 mg (4 mg Oral Given 07/28/22 0943)  iohexol (OMNIPAQUE) 300 MG/ML solution 100 mL (80 mLs Intravenous Contrast Given 07/28/22 1046)    ED Course/ Medical Decision Making/ A&P                             Medical Decision Making Amount and/or Complexity of Data Reviewed Labs: ordered. Radiology: ordered.  Risk Prescription drug management.   This patient presents to the ED for concern of rectal pain, this involves an extensive number of treatment options, and is a complaint that carries with it a high risk of complications and morbidity.  The differential diagnosis includes postop inflammatory changes, abscess formation, cellulitis, fistula, hemorrhoid, thrombosed hemorrhoid   Co morbidities that complicate the patient evaluation  See HPI   Additional history obtained:  Additional history obtained from EMR External records from outside source obtained and reviewed including hospital records   Lab Tests:  I Ordered, and personally interpreted labs.  The  pertinent results include: No leukocytosis noted.  No evidence of anemia.  Platelets within normal range.  No electrolyte abnormalities appreciated.  No transaminitis.  No renal dysfunction.   Imaging Studies ordered:  I ordered imaging studies including CT abdomen pelvis I independently visualized and interpreted imaging which showed mild intimal thickening with no fistula or abscess formation.  2 punctate left kidney upper pole renal calculi.  Stable transient hepatic attenuation in right hepatic lobe without acute change.  Similarly subtle focus of enhancement in the dome of right hepatic lobe.  Patient has no transaminitis so further workup in the emergency department deemed unnecessary. I agree with the radiologist interpretation  Cardiac Monitoring: / EKG:  The patient was maintained on a cardiac monitor.  I personally viewed and interpreted the cardiac monitored which showed an underlying rhythm of: Sinus rhythm   Consultations Obtained:  I requested consultation with attending  physician Dr. Maylon Peppers who was in agreement with treatment plan going forward.  Problem List / ED Course / Critical interventions / Medication management  Rectal pain I ordered medication including Zofran, Benadryl, Percocet   Reevaluation of the patient after these medicines showed that the patient improved I have reviewed the patients home medicines and have made adjustments as needed   Social Determinants of Health:  Former cigarette use.  Denies illicit drug use.   Test / Admission - Considered:  Rectal pain Vitals signs normal range and stable throughout visit. Laboratory/imaging studies significant for: See above Patient with evidence of rectal pain without acute abscess/fistula formation.  Changes seem similar to inflammatory changes secondary to surgical intervention as indicated above.  Patient without evidence of acute anemia secondary to blood loss.  Hemoglobin stable from prior studies.   Internal rectal examination withheld at this time secondary to recent procedure and concern for possible interfering with recently banded internal hemorrhoids.  Discussed case with attending physician Dr. Maylon Peppers who is in agreement with treatment plan going forward.  Patient recommended outpatient Tylenol for baseline pain with pain medication for breakthrough pain.  Patient recommended to avoid NSAIDs given current intermittent bleeding.  Patient also recommended stool softener in the form of MiraLAX to titrate for soft regular stools.  Recommend follow-up with general surgery through [facility] earliest convenience.  Treatment plan discussed at length with patient and she acknowledged understanding was agreeable to said plan. Worrisome signs and symptoms were discussed with the patient, and the patient acknowledged understanding to return to the ED if noticed. Patient was stable upon discharge.         Final Clinical Impression(s) / ED Diagnoses Final diagnoses:  Rectal pain    Rx / DC Orders ED Discharge Orders     None         Wilnette Kales, [place] 07/28/22 [place], [place], DO 07/28/22 1518

## 2022-07-28 NOTE — Discharge Instructions (Addendum)
Note the workup today was overall reassuring.  Imaging studies reviewed inflammation of the rectum with no obvious abscess or pocket of infection.  Recommend the use of MiraLAX to help keep your stools soft.  Begin with 2-3 capfuls per day and increase/decrease as needed for soft bowel movements.  We do not want diarrhea to further irritate your rectum so cut back on MiraLAX if this begins to happen. You can find the medication over-the-counter at your local pharmacy and the generic form called polyethylene glycol. Recommend continued use of sitz bath's 2-3 times daily to help with your symptoms.  Take Tylenol for baseline pain with pain medication for breakthrough pain.  Follow-up with general surgeon at [facility] for reevaluation.  Please do not hesitate to return to emergency department if the worrisome signs and symptoms we discussed become apparent.

## 2022-07-28 NOTE — ED Notes (Signed)
Patient transported to CT 

## 2022-07-28 NOTE — ED Triage Notes (Signed)
Pt reports having hemorrhoid surgery on Thursday, reports increased pain and is out of Tramadol, reports she called surgoen's office and they told pt to come to ER

## 2022-08-12 HISTORY — PX: RECTAL EXAM UNDER ANESTHESIA: SHX6399

## 2022-08-25 ENCOUNTER — Telehealth: Payer: Self-pay

## 2022-08-25 NOTE — Telephone Encounter (Signed)
Mychart msg sent. AS, CMA

## 2022-09-30 ENCOUNTER — Encounter (HOSPITAL_BASED_OUTPATIENT_CLINIC_OR_DEPARTMENT_OTHER): Payer: Self-pay | Admitting: Radiology

## 2022-09-30 ENCOUNTER — Other Ambulatory Visit (HOSPITAL_BASED_OUTPATIENT_CLINIC_OR_DEPARTMENT_OTHER): Payer: Self-pay

## 2022-09-30 ENCOUNTER — Emergency Department (HOSPITAL_BASED_OUTPATIENT_CLINIC_OR_DEPARTMENT_OTHER): Payer: Medicaid Other

## 2022-09-30 ENCOUNTER — Other Ambulatory Visit: Payer: Self-pay

## 2022-09-30 ENCOUNTER — Emergency Department (HOSPITAL_BASED_OUTPATIENT_CLINIC_OR_DEPARTMENT_OTHER)
Admission: EM | Admit: 2022-09-30 | Discharge: 2022-09-30 | Disposition: A | Discharge status: Home / Self Care | Payer: Medicaid Other | Attending: Emergency Medicine | Admitting: Emergency Medicine

## 2022-09-30 DIAGNOSIS — K6289 Other specified diseases of anus and rectum: Secondary | ICD-10-CM | POA: Diagnosis present

## 2022-09-30 DIAGNOSIS — K644 Residual hemorrhoidal skin tags: Secondary | ICD-10-CM | POA: Diagnosis not present

## 2022-09-30 LAB — COMPREHENSIVE METABOLIC PANEL
ALT: 9 U/L (ref 0–44)
AST: 9 U/L — ABNORMAL LOW (ref 15–41)
Albumin: 4.1 g/dL (ref 3.5–5.0)
Alkaline Phosphatase: 58 U/L (ref 38–126)
Anion gap: 5 (ref 5–15)
BUN: 9 mg/dL (ref 6–20)
CO2: 24 mmol/L (ref 22–32)
Calcium: 8.7 mg/dL — ABNORMAL LOW (ref 8.9–10.3)
Chloride: 109 mmol/L (ref 98–111)
Creatinine, Ser: 0.51 mg/dL (ref 0.44–1.00)
GFR, Estimated: >60 mL/min (ref >60)
Glucose, Bld: 111 mg/dL — ABNORMAL HIGH (ref 70–99)
Potassium: 3.7 mmol/L (ref 3.5–5.1)
Sodium: 138 mmol/L (ref 135–145)
Total Bilirubin: 0.9 mg/dL (ref 0.3–1.2)
Total Protein: 6.5 g/dL (ref 6.5–8.1)

## 2022-09-30 LAB — CBC WITH DIFFERENTIAL/PLATELET
Abs Immature Granulocytes: 0.03 10*3/uL (ref 0.00–0.07)
Basophils Absolute: 0 10*3/uL (ref 0.0–0.1)
Basophils Relative: 0 %
Eosinophils Absolute: 0.1 10*3/uL (ref 0.0–0.5)
Eosinophils Relative: 1 %
HCT: 32.7 % — ABNORMAL LOW (ref 36.0–46.0)
Hemoglobin: 11.3 g/dL — ABNORMAL LOW (ref 12.0–15.0)
Immature Granulocytes: 0 %
Lymphocytes Relative: 17 %
Lymphs Abs: 1.7 10*3/uL (ref 0.7–4.0)
MCH: 30.3 pg (ref 26.0–34.0)
MCHC: 34.6 g/dL (ref 30.0–36.0)
MCV: 87.7 fL (ref 80.0–100.0)
Monocytes Absolute: 0.4 10*3/uL (ref 0.1–1.0)
Monocytes Relative: 4 %
Neutro Abs: 7.7 10*3/uL (ref 1.7–7.7)
Neutrophils Relative %: 78 %
Platelets: 241 10*3/uL (ref 150–400)
RBC: 3.73 MIL/uL — ABNORMAL LOW (ref 3.87–5.11)
RDW: 13.2 % (ref 11.5–15.5)
WBC: 9.9 10*3/uL (ref 4.0–10.5)
nRBC: 0 % (ref 0.0–0.2)

## 2022-09-30 LAB — HCG, QUANTITATIVE, PREGNANCY: hCG, Beta Chain, Quant, S: <1 m[IU]/mL (ref <5)

## 2022-09-30 LAB — URINALYSIS, ROUTINE W REFLEX MICROSCOPIC
Bacteria, UA: NONE SEEN
Bilirubin Urine: NEGATIVE
Glucose, UA: NEGATIVE mg/dL
Ketones, ur: NEGATIVE mg/dL
Leukocytes,Ua: NEGATIVE
Nitrite: NEGATIVE
Protein, ur: NEGATIVE mg/dL
Specific Gravity, Urine: 1.012 (ref 1.005–1.030)
pH: 7 (ref 5.0–8.0)

## 2022-09-30 LAB — PREGNANCY, URINE: Preg Test, Ur: NEGATIVE

## 2022-09-30 LAB — LIPASE, BLOOD: Lipase: <10 U/L — ABNORMAL LOW (ref 11–51)

## 2022-09-30 MED ORDER — ONDANSETRON HCL 4 MG PO TABS: 4.0000 mg | ORAL_TABLET | Freq: Four times a day (QID) | ORAL | 0 refills | Status: DC

## 2022-09-30 MED ORDER — BISACODYL 10 MG RE SUPP: 10.0000 mg | RECTAL | 0 refills | Status: AC | PRN

## 2022-09-30 MED ORDER — SODIUM CHLORIDE 0.9 % IV BOLUS
1000.0000 mL | Freq: Once | INTRAVENOUS | Status: AC
  Administered 2022-09-30: 1000 mL via INTRAVENOUS

## 2022-09-30 MED ORDER — OXYCODONE HCL 5 MG PO TABS
5.0000 mg | ORAL_TABLET | Freq: Once | ORAL | Status: AC
  Administered 2022-09-30: 5 mg via ORAL
  Filled 2022-09-30: qty 1

## 2022-09-30 MED ORDER — IOHEXOL 300 MG/ML  SOLN
100.0000 mL | Freq: Once | INTRAMUSCULAR | Status: AC | PRN
  Administered 2022-09-30: 80 mL via INTRAVENOUS

## 2022-09-30 MED ORDER — ONDANSETRON HCL 4 MG/2ML IJ SOLN
4.0000 mg | Freq: Once | INTRAMUSCULAR | Status: AC
  Administered 2022-09-30: 4 mg via INTRAVENOUS
  Filled 2022-09-30: qty 2

## 2022-09-30 MED ORDER — HYDROCORTISONE (PERIANAL) 2.5 % EX CREA: 1.0000 | TOPICAL_CREAM | Freq: Two times a day (BID) | CUTANEOUS | 0 refills | Status: DC

## 2022-09-30 NOTE — ED Triage Notes (Signed)
Pt arrived POV with c/o rectal pain. Has history with this complaint for years and has an appt with colorectal surgeon Monday. Recently got insurance. Is currently out of pain medications and external cream. External and internal hemorrhoids.

## 2022-09-30 NOTE — ED Notes (Signed)
Pt given discharge instructions and reviewed prescriptions. Opportunities given for questions. Pt verbalizes understanding. PIV removed x1. Elianys Conry R, RN 

## 2022-09-30 NOTE — Discharge Instructions (Addendum)
It was a pleasure taking care of you today!  You will be sent a prescription for Dulcolax, Zofran, and hydrocortisone cream, use as directed. You may utilize sitz baths.  Maintain your follow-up appointment as scheduled with your colorectal surgeon.  You will also be provided with information for OB/GYN specialist in the area to follow-up regarding today's ED visit.  Call and set up a follow-up appointment regarding today's ED visit. Ensure that you increase your fiber intake with fruits and veggies. Also increase your water intake. Return to the ED if you are experiencing worsening symptoms.

## 2022-09-30 NOTE — ED Provider Notes (Signed)
[facility] Provider Note   CSN: [id number] Arrival date & time: 09/30/22  1014     History  Chief Complaint  Patient presents with   Rectal Pain    Karn L Radich is a 37 y.o. female who presents to the ED with concerns for rectal pain onset several years. Notes that this has been going on for 10 years. She has a follow up appointment with a colorectal surgeon on 10/04/22. Has not tried any medications for her symptoms. Notes that she has run out of the medications that were prescribed to her. Has associated rectal bleeding, lower abdominal pain, nausea. Notes that these symptoms have been ongoing for awhile. Denies urinary symptoms or fever.   The history is provided by the patient. No language interpreter was used.       Home Medications Prior to Admission medications   Medication Sig Start Date End Date Taking? Authorizing Provider  clonazePAM (KLONOPIN) 1 MG tablet Take 1 mg by mouth 2 (two) times daily as needed. 09/18/22  Yes [provider]  REXULTI 1 MG TABS tablet Take 1 mg by mouth at bedtime. 09/20/22  Yes [provider]  albuterol (VENTOLIN HFA) 108 (90 Base) MCG/ACT inhaler Inhale into the lungs every 6 (six) hours as needed for wheezing or shortness of breath.    [provider]  amphetamine-dextroamphetamine (ADDERALL XR) 10 MG 24 hr capsule Take 10 mg by mouth daily. 07/01/22   [provider]  amphetamine-dextroamphetamine (ADDERALL XR) 15 MG 24 hr capsule Take 15 mg by mouth daily. 07/19/22   [provider]  amphetamine-dextroamphetamine (ADDERALL) 5 MG tablet Take 5 mg by mouth. 06/28/22   [provider]  bisacodyl (DULCOLAX) 10 MG suppository Place 1 suppository (10 mg total) rectally as needed for moderate constipation. 09/30/22   Howard Patton A, PA-C  CORTIFOAM 10 % FOAM Place 1 applicator rectally 2 (two) times daily. 07/21/22   [provider]   dicyclomine (BENTYL) 20 MG tablet Take 1 tablet (20 mg total) by mouth 2 (two) times daily. 02/16/20   Drenda Freeze, MD  doxycycline (MONODOX) 100 MG capsule Take 100 mg by mouth 2 (two) times daily. 07/16/22   [provider]  DULoxetine (CYMBALTA) 60 MG capsule Take 60 mg by mouth daily.    [provider]  hydrocortisone (ANUSOL-HC) 2.5 % rectal cream Place 1 Application rectally 2 (two) times daily. 09/30/22   Serina Nichter A, PA-C  hydrOXYzine (ATARAX) 50 MG tablet Take 50 mg by mouth at bedtime. 06/18/22   [provider]  lidocaine (XYLOCAINE) 2 % solution Use as directed 15 mLs in the mouth or throat every 4 (four) hours as needed for mouth pain. 01/29/21   Sherwood Gambler, MD  ondansetron (ZOFRAN) 4 MG tablet Take 1 tablet (4 mg total) by mouth every 6 (six) hours. 09/30/22   Ova Meegan A, PA-C  oxyCODONE (ROXICODONE) 5 MG immediate release tablet Take 1 tablet (5 mg total) by mouth every 6 (six) hours as needed for severe pain or breakthrough pain. 07/28/22   Wilnette Kales, PA  QUEtiapine (SEROQUEL XR) 400 MG 24 hr tablet Take 400 mg by mouth every evening. 07/19/22   [provider]      Allergies    Vicodin [hydrocodone-acetaminophen]    Review of Systems   Review of Systems  All other systems reviewed and are negative.   Physical Exam Updated Vital Signs BP 122/83 (BP Location: Right  Arm)   Pulse 95   Temp 98.5 F (36.9 C) (Oral)   Resp 16   Ht 5\' 2"  (1.575 m)   Wt 86.2 kg   LMP 09/30/2022   SpO2 98%   BMI 34.75 kg/m  Physical Exam Vitals and nursing note reviewed.  Constitutional:      General: She is not in acute distress.    Appearance: She is not diaphoretic.  HENT:     Head: Normocephalic and atraumatic.     Mouth/Throat:     Pharynx: No oropharyngeal exudate.  Eyes:     General: No scleral icterus.    Conjunctiva/sclera: Conjunctivae normal.  Cardiovascular:     Rate and Rhythm: Normal rate and regular rhythm.      Pulses: Normal pulses.     Heart sounds: Normal heart sounds.  Pulmonary:     Effort: Pulmonary effort is normal. No respiratory distress.     Breath sounds: Normal breath sounds. No wheezing.  Abdominal:     General: Bowel sounds are normal.     Palpations: Abdomen is soft. There is no mass.     Tenderness: There is abdominal tenderness. There is no guarding or rebound.     Comments: Lower abdominal TTP.   Genitourinary:    Comments: RN chaperone Geographical information systems officer) present for rectal exam.  External non-thrombosed hemorrhoids noted. Unable to complete DRE due to tenderness to the area. No gross blood noted externally. No appreciable visualization of anal fissure.  Musculoskeletal:        General: Normal range of motion.     Cervical back: Normal range of motion and neck supple.  Skin:    General: Skin is warm and dry.  Neurological:     Mental Status: She is alert.  Psychiatric:        Behavior: Behavior normal.     ED Results / Procedures / Treatments   Labs (all labs ordered are listed, but only abnormal results are displayed) Labs Reviewed  COMPREHENSIVE METABOLIC PANEL - Abnormal; Notable for the following components:      Result Value   Glucose, Bld 111 (*)    Calcium 8.7 (*)    AST 9 (*)    All other components within normal limits  CBC WITH DIFFERENTIAL/PLATELET - Abnormal; Notable for the following components:   RBC 3.73 (*)    Hemoglobin 11.3 (*)    HCT 32.7 (*)    All other components within normal limits  URINALYSIS, ROUTINE W REFLEX MICROSCOPIC - Abnormal; Notable for the following components:   Hgb urine dipstick LARGE (*)    All other components within normal limits  LIPASE, BLOOD - Abnormal; Notable for the following components:   Lipase <10 (*)    All other components within normal limits  PREGNANCY, URINE  HCG, QUANTITATIVE, PREGNANCY    EKG None  Radiology CT Abdomen Pelvis W Contrast  Result Date: 09/30/2022 CLINICAL DATA:  Left lower quadrant  abdominal pain. Rectal pain. Prior hemorrhoid banding surgery. EXAM: CT ABDOMEN AND PELVIS WITH CONTRAST TECHNIQUE: Multidetector CT imaging of the abdomen and pelvis was performed using the standard protocol following bolus administration of intravenous contrast. RADIATION DOSE REDUCTION: This exam was performed according to the departmental dose-optimization program which includes automated exposure control, adjustment of the mA and/or kV according to patient size and/or use of iterative reconstruction technique. CONTRAST:  49mL OMNIPAQUE IOHEXOL 300 MG/ML  SOLN COMPARISON:  Multiple priors including most recent CT July 28, 2022 FINDINGS: Lower chest: No  acute abnormality. Hepatobiliary: Stable transient hepatic attenuation differences in the posterior right hepatic lobe on image 26/2. Stable 8 mm lesion in the dome of the liver demonstrating peripheral nodular discontinuous enhancement on image 47/5 compatible with a benign hemangioma. Gallbladder is unremarkable. No biliary ductal dilation. Pancreas: No pancreatic ductal dilation or evidence of acute inflammation. Spleen: No splenomegaly. Adrenals/Urinary Tract: Bilateral adrenal glands. No hydronephrosis. Punctate nonobstructive left renal stones. Urinary bladder is unremarkable for degree of distension. Stomach/Bowel: No radiopaque enteric contrast material was administered. Stomach is minimally distended limiting evaluation. No pathologic dilation of small or large bowel. Normal appendix. No evidence of acute bowel inflammation. Vascular/Lymphatic: Normal caliber abdominal aorta. Smooth IVC contours. No pathologically enlarged abdominal or pelvic lymph nodes. Reproductive: Dominant follicle in the right ovary is physiologic/benign measuring 2.6 cm on image 77/2 and requiring no independent imaging follow-up. Tampon in the vagina. Other: Small volume abdominal free fluid with mesenteric stranding centered in the right adnexa. Musculoskeletal: No acute  osseous abnormality. IMPRESSION: 1. Small volume abdominal free fluid with mesenteric stranding centered in the right adnexa, nonspecific but possibly reflecting a ruptured ovarian cyst or PID suggest correlation with physical examination and laboratory values. 2. Punctate nonobstructive left renal stones. Electronically Signed   By: Dahlia Bailiff M.D.   On: 09/30/2022 13:30    Procedures Procedures    Medications Ordered in ED Medications  sodium chloride 0.9 % bolus 1,000 mL (1,000 mLs Intravenous New Bag/Given 09/30/22 1152)  ondansetron (ZOFRAN) injection 4 mg (4 mg Intravenous Given 09/30/22 1206)  iohexol (OMNIPAQUE) 300 MG/ML solution 100 mL (80 mLs Intravenous Contrast Given 09/30/22 1312)  oxyCODONE (Oxy IR/ROXICODONE) immediate release tablet 5 mg (5 mg Oral Given 09/30/22 1502)    ED Course/ Medical Decision Making/ A&P Clinical Course as of 09/30/22 1800  Thu Sep 30, 2022  1111 Per RN, patient ntoes that she doesn't want a CT scan or labs at this time.  [SB]  1111 Pt re-evaluated and noted that she doesn't want to proceed with labs and imaging.  She notes that the only reason that she had the lab and imaging studies done previously was because they were not aware of what was going on.  She notes that since her last evaluation in January she has had the banding of her internal hemorrhoid with her gastroenterologist at this time she notes that she does not want to proceed with labs or imaging.   [SB]  1141 Discussed with patient regarding AMA paperwork due to patient not wanting to proceed with workup.  Patient notes that she thought the lab and imaging studies were going to be done due to her hemorrhoids.  Explained to patient that this is due to her lower abdominal pain to rule out any emergent findings at that time.  Patient understand verbalizes that she will stay for the workup for her abdomen. [SB]  1450 Reevaluated and discussed with patient lab and imaging findings.  Discussed with  patient treatment plan consisting of self swab, prophylactic treatment for gonorrhea and chlamydia in the setting of ruptured cyst versus possible..  Patient notes that she is not sexually active and she does not want to self swab at this time.  Patient also declines prophylactic treatment of gonorrhea and chlamydia at this time.  Discussed with patient discharge treatment plan consisting of follow-up with OB/GYN.  Answered all questions.  Patient for safe discharge at this time. [SB]    Clinical Course User Index [SB] Maliaka Brasington A, PA-C  Medical Decision Making Amount and/or Complexity of Data Reviewed Labs: ordered. Radiology: ordered.  Risk OTC drugs. Prescription drug management.   Pt presents with rectal pain onset 10 years. History of internal hemorrhoid banding. Vital signs, patient afebrile. On exam, pt with lower abdominal TTP. RN chaperone Leonidas Romberg) present for rectal exam.  External non-thrombosed hemorrhoids noted. Unable to complete DRE due to tenderness to the area. No gross blood noted externally. No appreciable visualization of anal fissure. No acute cardiovascular, respiratory, abdominal exam findings. Differential diagnosis includes external hemorrhoid, thrombosed hemorrhoid, anal fissure, diverticulitis, acute cystitis, STI.   Labs:  I ordered, and personally interpreted labs.  The pertinent results include:   Lipase unremarkable CBC without leukocytosis and unremarkable CMP unremarkable Urinalysis with regimen hemoglobin (patient is currently on her menstrual cycle) Negative hCG urine.  Negative hCG quant  Imaging: I ordered imaging studies including CT abdomen pelvis with I independently visualized and interpreted imaging which showed:  1. Small volume abdominal free fluid with mesenteric stranding  centered in the right adnexa, nonspecific but possibly reflecting a  ruptured ovarian cyst or PID suggest correlation with physical   examination and laboratory values.  2. Punctate nonobstructive left renal stones.   I agree with the radiologist interpretation  Medications:  I ordered medication including IV fluids, oxycodone for symptom management Reevaluation of the patient after these medicines and interventions, I reevaluated the patient and found that they have improved I have reviewed the patients home medicines and have made adjustments as needed    Disposition: Presenting suspicious for rectal pain in the setting of external nonthrombosed hemorrhoids.  Doubt concerns at this time for anal fissure.  Unable to complete DRE due to patient's pain level.  Patient has a follow-up appointment with her colorectal surgeon next week.  Advised patient to maintain that follow-up appointment.  Doubt concerns at this time for acute cystitis.  Offered to test today for gonorrhea chlamydia, patient declines at this time and notes that she does not want to proceed with that testing today.  Offered to prophylactically treat for gonorrhea and chlamydia today due to incidental findings of possible ruptured ovarian cyst versus.  On CT scan.  Patient declines at this time and said that she will follow-up with the health department or her primary care provider.  Doubt concerns at this time for diverticulitis.. After consideration of the diagnostic results and the patients response to treatment, I feel that the patient would benefit from Discharge home.  Patient sent with a prescription for Zofran, Anusol, Dulcolax.  Supportive care measures and strict return precautions discussed with patient at bedside. Pt acknowledges and verbalizes understanding. Pt appears safe for discharge. Follow up as indicated in discharge paperwork.    This chart was dictated using voice recognition software, Dragon. Despite the best efforts of this provider to proofread and correct errors, errors may still occur which can change documentation meaning.   Final  Clinical Impression(s) / ED Diagnoses Final diagnoses:  Rectal pain    Rx / DC Orders ED Discharge Orders          Ordered    ondansetron (ZOFRAN) 4 MG tablet  Every 6 hours        09/30/22 1448    hydrocortisone (ANUSOL-HC) 2.5 % rectal cream  2 times daily        09/30/22 1448    bisacodyl (DULCOLAX) 10 MG suppository  As needed        09/30/22 1452  Cassundra Mckeever A, PA-C 09/30/22 1800    Jeanell Sparrow, DO 10/05/22 [phone]

## 2022-10-05 ENCOUNTER — Ambulatory Visit: Payer: Self-pay | Admitting: General Surgery

## 2022-10-05 NOTE — H&P (View-Only) (Signed)
REFERRING PHYSICIAN:  Shahid, Shabana, MD   PROVIDER:  Karrissa Parchment CHRISTINE Natanel Snavely, MD   MRN: [id number] DOB: 09/10/1985 DATE OF ENCOUNTER: 10/05/2022   Subjective    Chief Complaint: New Consultation       History of Present Illness: Brandi Fleming is a 36 y.o. female who is seen today as an office consultation at the request of Dr. Shahid for evaluation of New Consultation .   Patient has been having intermittent pain for many years.  She has not had treatment due to lack of insurance.  Here recently she was able to get on insurance and have the problem addressed.  She reports significant pain with bowel movements.  She was seen by Dr.Shahid and exam under anesthesia and colonoscopy with rubber band ligation of internal hemorrhoids was performed.  Of note, the terminal ileum appeared normal.  Biopsies of the colon showed colitis on 1 and normal colon on the other.  She was sent here for further evaluation.     Review of Systems: A complete review of systems was obtained from the patient.  I have reviewed this information and discussed as appropriate with the patient.  See HPI as well for other ROS.     Medical History: Past Medical History      Past Medical History:  Diagnosis Date   Affective bipolar disorder (CMS-HCC)     Anemia     Anxiety     Asthma, unspecified asthma severity, unspecified whether complicated, unspecified whether persistent     Depression     Lactose intolerance             Patient Active Problem List  Diagnosis   Obesity in pregnancy, antepartum   History of gestational hypertension   Rh negative status during pregnancy in first trimester   Family disruption due to family member on non-military extended absence from home   Anemia affecting pregnancy in third trimester   History of asthma   Uncontrolled persistent asthma      Past Surgical History       Past Surgical History:  Procedure Laterality Date   wisdom teeth            Allergies       Allergies  Allergen Reactions   Hydrocodone-Acetaminophen Itching              Current Outpatient Medications on File Prior to Visit  Medication Sig Dispense Refill   dextroamphetamine-amphetamine (ADDERALL XR) 20 MG XR capsule         arformoterol (BROVANA) 15 mcg/2 mL nebulizer solution Inhale into the lungs.       budesonide (PULMICORT) 0.5 mg/2 mL nebulizer solution Take 0.5 mg by nebulization once daily.       metroNIDAZOLE (METROGEL) 0.75 % vaginal gel Place 1 applicator vaginally nightly. (Patient not taking: Reported on 02/22/2017 ) 70 g 0   nitrofurantoin, macrocrystal-monohydrate, (MACROBID) 100 MG capsule Take 1 capsule (100 mg total) by mouth 2 (two) times daily. (Patient not taking: Reported on 02/22/2017 ) 10 capsule 0   prenatal vitamin-iron-FA-DHA-docusate (PRENEXA, FOLCAL DHA) capsule Take 1 capsule by mouth once daily.       tiotropium (SPIRIVA) 18 mcg inhalation capsule Place into inhaler and inhale.        No current facility-administered medications on file prior to visit.      Family History       Family History  Problem Relation Age of Onset   Heart disease Mother     Diabetes   Father     Heart disease Father     High blood pressure (Hypertension) Father     Emphysema Father     Epilepsy Father     Drug abuse Father     Lung cancer Father     Hepatitis C Father     Alcohol abuse Father     Colon cancer Maternal Grandmother          Social History        Tobacco Use  Smoking Status Former   Packs/day: .5   Types: Cigarettes   Quit date: 07/07/2016   Years since quitting: 6.2  Smokeless Tobacco Never      Social History  Social History         Socioeconomic History   Marital status: Single  Tobacco Use   Smoking status: Former      Packs/day: .5      Types: Cigarettes      Quit date: 07/07/2016      Years since quitting: 6.2   Smokeless tobacco: Never  Vaping Use   Vaping Use: Never used  Substance and Sexual Activity   Alcohol  use: No   Drug use: Yes      Types: Marijuana   Sexual activity: Yes      Partners: Male      Birth control/protection: None        Objective:         Vitals:    10/05/22 1059  BP: 118/68  Pulse: 97  Temp: 36.7 C (98 F)  SpO2: 98%  Weight: 86.6 kg (191 lb)  Height: 157.5 cm (5' 2")      Exam Gen: NAD Abd: soft Rectal: Anterior inflammation noted.  Possible external opening noted at anterior midline.     Labs, Imaging and Diagnostic Testing: Colonoscopy and GI notes reviewed   Assessment and Plan:  Diagnoses and all orders for this visit:   Anal pain     On exam today she does appear to have some anterior inflammation.  It is unclear to me whether this is due to a fistula or possibly a fissure.  She is unable to undergo a complete examination here in the office and therefore I recommend exam under anesthesia with possible fistulotomy or seton placement if a fistula is found.   No follow-ups on file.    Cielo Arias C Randel Hargens, MD Colon and Rectal Surgery [facility]    

## 2022-10-05 NOTE — H&P (Signed)
REFERRING PHYSICIAN:  Koleen Distance, MD   PROVIDER:  Monico Blitz, MD   MRN: [id number] DOB: 1986/06/26 DATE OF ENCOUNTER: 10/05/2022   Subjective    Chief Complaint: New Consultation       History of Present Illness: Brandi Fleming is a 37 y.o. female who is seen today as an office consultation at the request of Dr. Alan Ripper for evaluation of New Consultation .   Patient has been having intermittent pain for many years.  She has not had treatment due to lack of insurance.  Here recently she was able to get on insurance and have the problem addressed.  She reports significant pain with bowel movements.  She was seen by Dr.Shahid and exam under anesthesia and colonoscopy with rubber band ligation of internal hemorrhoids was performed.  Of note, the terminal ileum appeared normal.  Biopsies of the colon showed colitis on 1 and normal colon on the other.  She was sent here for further evaluation.     Review of Systems: A complete review of systems was obtained from the patient.  I have reviewed this information and discussed as appropriate with the patient.  See HPI as well for other ROS.     Medical History: Past Medical History      Past Medical History:  Diagnosis Date   Affective bipolar disorder (CMS-HCC)     Anemia     Anxiety     Asthma, unspecified asthma severity, unspecified whether complicated, unspecified whether persistent     Depression     Lactose intolerance             Patient Active Problem List  Diagnosis   Obesity in pregnancy, antepartum   History of gestational hypertension   Rh negative status during pregnancy in first trimester   Family disruption due to family member on non-military extended absence from home   Anemia affecting pregnancy in third trimester   History of asthma   Uncontrolled persistent asthma      Past Surgical History       Past Surgical History:  Procedure Laterality Date   wisdom teeth            Allergies       Allergies  Allergen Reactions   Hydrocodone-Acetaminophen Itching              Current Outpatient Medications on File Prior to Visit  Medication Sig Dispense Refill   dextroamphetamine-amphetamine (ADDERALL XR) 20 MG XR capsule         arformoterol (BROVANA) 15 mcg/2 mL nebulizer solution Inhale into the lungs.       budesonide (PULMICORT) 0.5 mg/2 mL nebulizer solution Take 0.5 mg by nebulization once daily.       metroNIDAZOLE (METROGEL) 0.75 % vaginal gel Place 1 applicator vaginally nightly. (Patient not taking: Reported on 02/22/2017 ) 70 g 0   nitrofurantoin, macrocrystal-monohydrate, (MACROBID) 100 MG capsule Take 1 capsule (100 mg total) by mouth 2 (two) times daily. (Patient not taking: Reported on 02/22/2017 ) 10 capsule 0   prenatal vitamin-iron-FA-DHA-docusate (PRENEXA, FOLCAL DHA) capsule Take 1 capsule by mouth once daily.       tiotropium (SPIRIVA) 18 mcg inhalation capsule Place into inhaler and inhale.        No current facility-administered medications on file prior to visit.      Family History       Family History  Problem Relation Age of Onset   Heart disease Mother     Diabetes  Father     Heart disease Father     High blood pressure (Hypertension) Father     Emphysema Father     Epilepsy Father     Drug abuse Father     Lung cancer Father     Hepatitis C Father     Alcohol abuse Father     Colon cancer Maternal Grandmother          Social History        Tobacco Use  Smoking Status Former   Packs/day: .5   Types: Cigarettes   Quit date: 07/07/2016   Years since quitting: 6.2  Smokeless Tobacco Never      Social History  Social History         Socioeconomic History   Marital status: Single  Tobacco Use   Smoking status: Former      Packs/day: .5      Types: Cigarettes      Quit date: 07/07/2016      Years since quitting: 6.2   Smokeless tobacco: Never  Vaping Use   Vaping Use: Never used  Substance and Sexual Activity   Alcohol  use: No   Drug use: Yes      Types: Marijuana   Sexual activity: Yes      Partners: Male      Birth control/protection: None        Objective:         Vitals:    10/05/22 1059  BP: 118/68  Pulse: 97  Temp: 36.7 C (98 F)  SpO2: 98%  Weight: 86.6 kg (191 lb)  Height: 157.5 cm (5\' 2" )      Exam Gen: NAD Abd: soft Rectal: Anterior inflammation noted.  Possible external opening noted at anterior midline.     Labs, Imaging and Diagnostic Testing: Colonoscopy and GI notes reviewed   Assessment and Plan:  Diagnoses and all orders for this visit:   Anal pain     On exam today she does appear to have some anterior inflammation.  It is unclear to me whether this is due to a fistula or possibly a fissure.  She is unable to undergo a complete examination here in the office and therefore I recommend exam under anesthesia with possible fistulotomy or seton placement if a fistula is found.   No follow-ups on file.    Brandi Adie, MD Colon and Rectal Surgery [facility]

## 2022-10-19 ENCOUNTER — Encounter (HOSPITAL_BASED_OUTPATIENT_CLINIC_OR_DEPARTMENT_OTHER): Payer: Self-pay | Admitting: General Surgery

## 2022-10-19 NOTE — Progress Notes (Signed)
Spoke w/ via phone for pre-op interview--- [facility] Lab needs dos----   UPT per anesthesia            Lab results------ COVID test -----patient states asymptomatic no test needed Arrive at -------1400 NPO after MN NO Solid Food.  Clear liquids from MN until---1300 Med rec completed Medications to take morning of surgery -----Klonopin and bring Albuterol Diabetic medication ----- Patient instructed no nail polish to be worn day of surgery Patient instructed to bring photo id and insurance card day of surgery Patient aware to have Driver (ride ) / caregiver Bertram Gala Mother   for 24 hours after surgery  Patient Special Instructions ----- Pre-Op special Istructions ----- Patient verbalized understanding of instructions that were given at this phone interview. Patient denies shortness of breath, chest pain, fever, cough at this phone interview.

## 2022-10-21 ENCOUNTER — Emergency Department (HOSPITAL_BASED_OUTPATIENT_CLINIC_OR_DEPARTMENT_OTHER): Payer: Medicaid Other

## 2022-10-21 ENCOUNTER — Encounter (HOSPITAL_BASED_OUTPATIENT_CLINIC_OR_DEPARTMENT_OTHER): Payer: Self-pay | Admitting: Emergency Medicine

## 2022-10-21 ENCOUNTER — Other Ambulatory Visit: Payer: Self-pay

## 2022-10-21 ENCOUNTER — Emergency Department (HOSPITAL_BASED_OUTPATIENT_CLINIC_OR_DEPARTMENT_OTHER)
Admission: EM | Admit: 2022-10-21 | Discharge: 2022-10-21 | Disposition: A | Discharge status: Home / Self Care | Payer: Medicaid Other | Attending: Emergency Medicine | Admitting: Emergency Medicine

## 2022-10-21 ENCOUNTER — Other Ambulatory Visit (HOSPITAL_BASED_OUTPATIENT_CLINIC_OR_DEPARTMENT_OTHER): Payer: Self-pay

## 2022-10-21 DIAGNOSIS — R300 Dysuria: Secondary | ICD-10-CM | POA: Diagnosis not present

## 2022-10-21 DIAGNOSIS — R110 Nausea: Secondary | ICD-10-CM | POA: Insufficient documentation

## 2022-10-21 DIAGNOSIS — K6289 Other specified diseases of anus and rectum: Secondary | ICD-10-CM | POA: Diagnosis present

## 2022-10-21 LAB — URINALYSIS, ROUTINE W REFLEX MICROSCOPIC
Bilirubin Urine: NEGATIVE
Glucose, UA: NEGATIVE mg/dL
Hgb urine dipstick: NEGATIVE
Ketones, ur: NEGATIVE mg/dL
Leukocytes,Ua: NEGATIVE
Nitrite: NEGATIVE
Specific Gravity, Urine: >1.046 — ABNORMAL HIGH (ref 1.005–1.030)
pH: 6.5 (ref 5.0–8.0)

## 2022-10-21 LAB — CBC WITH DIFFERENTIAL/PLATELET
Abs Immature Granulocytes: 0.01 10*3/uL (ref 0.00–0.07)
Basophils Absolute: 0 10*3/uL (ref 0.0–0.1)
Basophils Relative: 1 %
Eosinophils Absolute: 0.2 10*3/uL (ref 0.0–0.5)
Eosinophils Relative: 2 %
HCT: 36.1 % (ref 36.0–46.0)
Hemoglobin: 11.9 g/dL — ABNORMAL LOW (ref 12.0–15.0)
Immature Granulocytes: 0 %
Lymphocytes Relative: 21 %
Lymphs Abs: 1.6 10*3/uL (ref 0.7–4.0)
MCH: 29.2 pg (ref 26.0–34.0)
MCHC: 33 g/dL (ref 30.0–36.0)
MCV: 88.5 fL (ref 80.0–100.0)
Monocytes Absolute: 0.5 10*3/uL (ref 0.1–1.0)
Monocytes Relative: 6 %
Neutro Abs: 5.4 10*3/uL (ref 1.7–7.7)
Neutrophils Relative %: 70 %
Platelets: 242 10*3/uL (ref 150–400)
RBC: 4.08 MIL/uL (ref 3.87–5.11)
RDW: 13 % (ref 11.5–15.5)
WBC: 7.7 10*3/uL (ref 4.0–10.5)
nRBC: 0 % (ref 0.0–0.2)

## 2022-10-21 LAB — HCG, SERUM, QUALITATIVE: Preg, Serum: NEGATIVE

## 2022-10-21 LAB — BASIC METABOLIC PANEL
Anion gap: 10 (ref 5–15)
BUN: 8 mg/dL (ref 6–20)
CO2: 22 mmol/L (ref 22–32)
Calcium: 9.3 mg/dL (ref 8.9–10.3)
Chloride: 106 mmol/L (ref 98–111)
Creatinine, Ser: 0.54 mg/dL (ref 0.44–1.00)
GFR, Estimated: >60 mL/min (ref >60)
Glucose, Bld: 108 mg/dL — ABNORMAL HIGH (ref 70–99)
Potassium: 3.7 mmol/L (ref 3.5–5.1)
Sodium: 138 mmol/L (ref 135–145)

## 2022-10-21 MED ORDER — AMOXICILLIN-POT CLAVULANATE 875-125 MG PO TABS: 1.0000 | ORAL_TABLET | Freq: Two times a day (BID) | ORAL | 0 refills | Status: AC

## 2022-10-21 MED ORDER — OXYCODONE HCL 5 MG PO TABS: 5.0000 mg | ORAL_TABLET | Freq: Four times a day (QID) | ORAL | 0 refills | Status: DC | PRN

## 2022-10-21 MED ORDER — AMOXICILLIN-POT CLAVULANATE 875-125 MG PO TABS
1.0000 | ORAL_TABLET | Freq: Once | ORAL | Status: AC
  Administered 2022-10-21: 1 via ORAL
  Filled 2022-10-21: qty 1

## 2022-10-21 MED ORDER — MORPHINE SULFATE (PF) 4 MG/ML IV SOLN
4.0000 mg | Freq: Once | INTRAVENOUS | Status: AC
  Administered 2022-10-21: 4 mg via INTRAVENOUS
  Filled 2022-10-21: qty 1

## 2022-10-21 MED ORDER — SENNOSIDES-DOCUSATE SODIUM 8.6-50 MG PO TABS: 1.0000 | ORAL_TABLET | Freq: Every day | ORAL | 0 refills | Status: AC

## 2022-10-21 MED ORDER — IOHEXOL 300 MG/ML  SOLN
100.0000 mL | Freq: Once | INTRAMUSCULAR | Status: AC | PRN
  Administered 2022-10-21: 80 mL via INTRAVENOUS

## 2022-10-21 MED ORDER — OXYCODONE-ACETAMINOPHEN 5-325 MG PO TABS
1.0000 | ORAL_TABLET | Freq: Once | ORAL | Status: AC
  Administered 2022-10-21: 1 via ORAL
  Filled 2022-10-21: qty 1

## 2022-10-21 MED ORDER — ONDANSETRON HCL 4 MG/2ML IJ SOLN
4.0000 mg | Freq: Once | INTRAMUSCULAR | Status: AC
  Administered 2022-10-21: 4 mg via INTRAVENOUS
  Filled 2022-10-21: qty 2

## 2022-10-21 MED ORDER — ONDANSETRON HCL 4 MG PO TABS: 4.0000 mg | ORAL_TABLET | Freq: Four times a day (QID) | ORAL | 0 refills | Status: AC

## 2022-10-21 NOTE — ED Notes (Signed)
Discharge instructions, follow up care, and prescriptions reviewed and explained, pt verbalized understanding. Pt caox4, ambulatory, NAD on d/c.  

## 2022-10-21 NOTE — ED Triage Notes (Signed)
Pt arrives to ED with c/o severe rectal pain. She notes hx anorectal fistula. Scheduled for surgery on 4/17.

## 2022-10-21 NOTE — ED Notes (Signed)
Pt given a cup to collect a urine sample.

## 2022-10-21 NOTE — ED Provider Notes (Signed)
[facility] EMERGENCY DEPARTMENT AT [facility] Provider Note   CSN: [id number] Arrival date & time: 10/21/22  7829     History  Chief Complaint  Patient presents with   Rectal Pain    Brandi Fleming is a 37 y.o. female.  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  Patient states that she has been dealing with rectal pain for the last several months.  She states that she recently established with a colorectal surgeon who is concerned for possible rectal fistula and that she is due for surgery next Wednesday.  She states that over the last few days her pain has become more unbearable and she had to miss several days of work due to the pain.  She states after having a bowel movement this morning her pain significantly worsened which prompted her to come to the ER.  She states that she has associated nausea but denies any vomiting.  She denies any fevers or chills.  She denies any drainage from her rectum.  She states that she does have some associated dysuria and lower abdominal pain.  She states that she has been taking Tylenol and Motrin for pain as well as doing sitz bath's.        Home Medications Prior to Admission medications   Medication Sig Start Date End Date Taking? Authorizing Provider  amoxicillin-clavulanate (AUGMENTIN) 875-125 MG tablet Take 1 tablet by mouth every 12 (twelve) hours. 10/21/22  Yes Theresia Lo, Turkey K, DO  ondansetron (ZOFRAN) 4 MG tablet Take 1 tablet (4 mg total) by mouth every 6 (six) hours. 10/21/22  Yes Theresia Lo, Benetta Spar K, DO  oxyCODONE (ROXICODONE) 5 MG immediate release tablet Take 1 tablet (5 mg total) by mouth every 6 (six) hours as needed for severe pain. 10/21/22  Yes Theresia Lo, Turkey K, DO  senna-docusate (SENOKOT-S) 8.6-50 MG tablet Take 1 tablet by mouth daily.  10/21/22  Yes Theresia Lo, Benetta Spar K, DO  albuterol (VENTOLIN HFA) 108 (90 Base) MCG/ACT inhaler Inhale into the lungs every 6 (six) hours as needed for wheezing or shortness of breath.    [provider]  amphetamine-dextroamphetamine (ADDERALL XR) 10 MG 24 hr capsule Take 10 mg by mouth daily. 07/01/22   [provider]  amphetamine-dextroamphetamine (ADDERALL XR) 15 MG 24 hr capsule Take 15 mg by mouth daily. 07/19/22   [provider]  amphetamine-dextroamphetamine (ADDERALL XR) 20 MG 24 hr capsule Take 20 mg by mouth daily.    [provider]  amphetamine-dextroamphetamine (ADDERALL) 5 MG tablet Take 5 mg by mouth. 06/28/22   [provider]  bisacodyl (DULCOLAX) 10 MG suppository Place 1 suppository (10 mg total) rectally as needed for moderate constipation. 09/30/22   Blue, Soijett A, PA-C  clonazePAM (KLONOPIN) 1 MG tablet Take 1 mg by mouth 2 (two) times daily as needed. 09/18/22   [provider]  CORTIFOAM 10 % FOAM Place 1 applicator rectally 2 (two) times daily. 07/21/22   [provider]  dicyclomine (BENTYL) 20 MG tablet Take 1 tablet (20 mg total) by mouth 2 (two) times daily. 02/16/20   Charlynne Pander, MD  doxycycline (MONODOX) 100 MG capsule Take 100 mg by mouth 2 (two) times daily. 07/16/22   [provider]  DULoxetine (CYMBALTA) 60 MG capsule Take 60 mg by mouth daily.    [provider]  hydrocortisone (  ANUSOL-HC) 2.5 % rectal cream Place 1 Application rectally 2 (two) times daily. 09/30/22   Blue, Soijett A, PA-C  hydrOXYzine (ATARAX) 50 MG tablet Take 50 mg by mouth at bedtime. 06/18/22   [provider]  lidocaine (XYLOCAINE) 2 % solution Use as directed 15 mLs in the mouth or throat every 4 (four) hours as needed for mouth pain. 01/29/21   Pricilla LovelessGoldston, Scott, MD  QUEtiapine (SEROQUEL XR) 400 MG 24 hr tablet Take 400 mg by mouth every evening. 07/19/22   [provider]  REXULTI 1 MG TABS tablet  Take 1 mg by mouth at bedtime. 09/20/22   [provider]      Allergies    Vicodin [hydrocodone-acetaminophen]    Review of Systems   Review of Systems  Physical Exam Updated Vital Signs BP 132/83   Pulse (!) 54   Temp 97.7 F (36.5 C) (Oral)   Resp 14   LMP 09/30/2022   SpO2 99%  Physical Exam Vitals and nursing note reviewed.  Constitutional:      General: She is not in acute distress.    Appearance: Normal appearance.  HENT:     Head: Normocephalic and atraumatic.     Nose: Nose normal.     Mouth/Throat:     Mouth: Mucous membranes are moist.     Pharynx: Oropharynx is clear.  Eyes:     Extraocular Movements: Extraocular movements intact.     Conjunctiva/sclera: Conjunctivae normal.  Cardiovascular:     Rate and Rhythm: Normal rate and regular rhythm.     Heart sounds: Normal heart sounds.  Pulmonary:     Effort: Pulmonary effort is normal.     Breath sounds: Normal breath sounds.  Abdominal:     General: Abdomen is flat.     Palpations: Abdomen is soft.     Tenderness: There is no abdominal tenderness.  Genitourinary:    Comments: External exam of rectum shows inflamed skin tags vs small non-thrombosed hemorrhoids, no significant erythema or warmth, no palpable fluctuance Musculoskeletal:        General: Normal range of motion.     Cervical back: Normal range of motion.  Skin:    General: Skin is warm and dry.  Neurological:     General: No focal deficit present.     Mental Status: She is alert and oriented to person, place, and time.  Psychiatric:        Mood and Affect: Mood normal.        Behavior: Behavior normal.     ED Results / Procedures / Treatments   Labs (all labs ordered are listed, but only abnormal results are displayed) Labs Reviewed  CBC WITH DIFFERENTIAL/PLATELET - Abnormal; Notable for the following components:      Result Value   Hemoglobin 11.9 (*)    All other components within normal limits  BASIC METABOLIC PANEL -  Abnormal; Notable for the following components:   Glucose, Bld 108 (*)    All other components within normal limits  URINALYSIS, ROUTINE W REFLEX MICROSCOPIC - Abnormal; Notable for the following components:   Color, Urine COLORLESS (*)    Specific Gravity, Urine >1.046 (*)    Protein, ur TRACE (*)    All other components within normal limits  HCG, SERUM, QUALITATIVE    EKG None  Radiology CT PELVIS W CONTRAST  Result Date: 10/21/2022 CLINICAL DATA:  Rectal pain. EXAM: CT PELVIS WITH CONTRAST TECHNIQUE: Multidetector CT imaging of the pelvis was performed  using the standard protocol following the bolus administration of intravenous contrast. RADIATION DOSE REDUCTION: This exam was performed according to the departmental dose-optimization program which includes automated exposure control, adjustment of the mA and/or kV according to patient size and/or use of iterative reconstruction technique. CONTRAST:  30mL OMNIPAQUE IOHEXOL 300 MG/ML  SOLN COMPARISON:  September 30, 2022. FINDINGS: Urinary Tract: Urinary bladder and visualized ureters are unremarkable. Bowel: There is no evidence of bowel obstruction. The appendix appears unremarkable. Mild-to-moderate rectal wall thickening is noted which may represent inflammation, but no definite fistula is noted. Vascular/Lymphatic: No pathologically enlarged lymph nodes. No significant vascular abnormality seen. Reproductive:  Uterus and adnexal regions are unremarkable. Other: No significant hernia is noted. Minimal amount of free fluid is noted in the pelvis which most likely is physiologic. Musculoskeletal: No suspicious bone lesions identified. IMPRESSION: Mild to moderate rectal wall thickening is noted which may represent inflammation or proctitis. No definite fistula is noted. Minimal amount of free fluid is noted in the pelvis which most likely is physiologic. Electronically Signed   By: Lupita Raider M.D.   On: 10/21/2022 09:00     Procedures Procedures    Medications Ordered in ED Medications  oxyCODONE-acetaminophen (PERCOCET/ROXICET) 5-325 MG per tablet 1 tablet (has no administration in time range)  amoxicillin-clavulanate (AUGMENTIN) 875-125 MG per tablet 1 tablet (has no administration in time range)  morphine (PF) 4 MG/ML injection 4 mg (4 mg Intravenous Given 10/21/22 0809)  ondansetron (ZOFRAN) injection 4 mg (4 mg Intravenous Given 10/21/22 0809)  iohexol (OMNIPAQUE) 300 MG/ML solution 100 mL (80 mLs Intravenous Contrast Given 10/21/22 0841)    ED Course/ Medical Decision Making/ A&P Clinical Course as of 10/21/22 1029  Thu Oct 21, 2022  0935 CT pelvis with rectal thickening concerning for proctitis, no signs of sepsis. [VK]  1023 UA negative. She denies any history of anal intercourse or STD concern. She will be treated with antibiotics for her proctitis with pain control and was recommended outpatient surgery follow up. [VK]    Clinical Course User Index [VK] Rexford Maus, DO                             Medical Decision Making This patient presents to the ED with chief complaint(s) of rectal pain with pertinent past medical history of rectal pain 2/2 possible fistula, bipolar disorder which further complicates the presenting complaint. The complaint involves an extensive differential diagnosis and also carries with it a high risk of complications and morbidity.    The differential diagnosis includes rectal fistula, rectal abscess, no evidence of cellulitis, UTI, diverticulitis   Additional history obtained: Additional history obtained from N/A Records reviewed Care Everywhere/External Records - outpatient general surgery records  ED Course and Reassessment: On patient's arrival she is uncomfortable appearing though is hemodynamically stable no significant distress.  She will be given pain control with morphine and Zofran for nausea.  She will have labs and urine and a CT pelvis to evaluate  for abscess or fistula she will be closely reassessed.  Independent labs interpretation:  The following labs were independently interpreted: within normal range  Independent visualization of imaging: - I independently visualized the following imaging with scope of interpretation limited to determining acute life threatening conditions related to emergency care: CT pelvis, which revealed proctitis   Consultation: - Consulted or discussed management/test interpretation w/ external professional: N/A  Consideration for admission or further workup: Patient has  no emergent conditions requiring admission or further work-up at this time and is stable for discharge home with general surgery follow-up  Social Determinants of health: N/A    Amount and/or Complexity of Data Reviewed Labs: ordered. Radiology: ordered.  Risk OTC drugs. Prescription drug management.          Final Clinical Impression(s) / ED Diagnoses Final diagnoses:  Proctitis    Rx / DC Orders ED Discharge Orders          Ordered    amoxicillin-clavulanate (AUGMENTIN) 875-125 MG tablet  Every 12 hours        10/21/22 1027    ondansetron (ZOFRAN) 4 MG tablet  Every 6 hours        10/21/22 1027    oxyCODONE (ROXICODONE) 5 MG immediate release tablet  Every 6 hours PRN        10/21/22 1027    senna-docusate (SENOKOT-S) 8.6-50 MG tablet  Daily        10/21/22 1027              Elayne Snare K, DO 10/21/22 1029

## 2022-10-21 NOTE — Discharge Instructions (Signed)
You were seen in the emergency department for your rectal pain.  Your CT showed inflammation of your rectum and this could be due to infection.  I have given you antibiotics and you should complete these as prescribed.  You can continue to take Tylenol and Motrin as needed for pain as well as to your sitz bath's and I have given you oxycodone to take for breakthrough pain.  This can make you drowsy so do not take it before working, driving or operating heavy machinery.  It can also make you constipated so gave you a stool softener that you should take while you are taking the oxycodone to help prevent constipation.  You should follow-up with your surgeon in the next few days to have your symptoms rechecked.  You should return to the emergency department if you are having fevers, repetitive vomiting or any other new or concerning symptoms.

## 2022-10-26 NOTE — Anesthesia Preprocedure Evaluation (Signed)
Anesthesia Evaluation  Patient identified by MRN, date of birth, ID band Patient awake    Reviewed: Allergy & Precautions, H&P , NPO status , Patient's Chart, lab work & pertinent test results  Airway Mallampati: II  TM Distance: >3 FB Neck ROM: Full    Dental no notable dental hx. (+) Teeth Intact, Dental Advisory Given   Pulmonary neg pulmonary ROS, asthma , former smoker   Pulmonary exam normal breath sounds clear to auscultation       Cardiovascular negative cardio ROS Normal cardiovascular exam Rhythm:Regular Rate:Normal     Neuro/Psych  PSYCHIATRIC DISORDERS   Bipolar Disorder   negative neurological ROS  negative psych ROS   GI/Hepatic negative GI ROS, Neg liver ROS,,,  Endo/Other  negative endocrine ROS    Renal/GU negative Renal ROS  negative genitourinary   Musculoskeletal negative musculoskeletal ROS (+)    Abdominal   Peds negative pediatric ROS (+)  Hematology negative hematology ROS (+) Lab Results      Component                Value               Date                      WBC                      7.7                 10/21/2022                HGB                      11.9 (L)            10/21/2022                HCT                      36.1                10/21/2022                MCV                      88.5                10/21/2022                PLT                      242                 10/21/2022              Anesthesia Other Findings All: vicodin  Reproductive/Obstetrics negative OB ROS                             Anesthesia Physical Anesthesia Plan  ASA: 3  Anesthesia Plan: General   Post-op Pain Management: Toradol IV (intra-op)*, Tylenol PO (pre-op)* and Dilaudid IV   Induction: Intravenous  PONV Risk Score and Plan: Treatment may vary due to age or medical condition, Ondansetron, Midazolam and Dexamethasone  Airway Management Planned: Oral  ETT  Additional Equipment: None  Intra-op Plan:   Post-operative Plan: Extubation in OR  Informed Consent: I have reviewed the patients History and Physical, chart, labs and discussed the procedure including the risks, benefits and alternatives for the proposed anesthesia with the patient or authorized representative who has indicated his/her understanding and acceptance.     Dental advisory given  Plan Discussed with:   Anesthesia Plan Comments:        Anesthesia Quick Evaluation

## 2022-10-26 NOTE — Progress Notes (Signed)
Left voice to come in at 0630. Clear liquids until 0530. Asked to return call to verify time change

## 2022-10-27 ENCOUNTER — Ambulatory Visit (HOSPITAL_BASED_OUTPATIENT_CLINIC_OR_DEPARTMENT_OTHER): Payer: Medicaid Other | Admitting: Anesthesiology

## 2022-10-27 ENCOUNTER — Ambulatory Visit (HOSPITAL_BASED_OUTPATIENT_CLINIC_OR_DEPARTMENT_OTHER)
Admission: RE | Admit: 2022-10-27 | Discharge: 2022-10-27 | Disposition: A | Discharge status: Home / Self Care | Payer: Medicaid Other | Source: Ambulatory Visit | Attending: General Surgery | Admitting: General Surgery

## 2022-10-27 ENCOUNTER — Other Ambulatory Visit: Payer: Self-pay

## 2022-10-27 ENCOUNTER — Encounter (HOSPITAL_BASED_OUTPATIENT_CLINIC_OR_DEPARTMENT_OTHER)
Admission: RE | Disposition: A | Discharge status: Home / Self Care | Payer: Self-pay | Source: Ambulatory Visit | Attending: General Surgery

## 2022-10-27 ENCOUNTER — Encounter (HOSPITAL_BASED_OUTPATIENT_CLINIC_OR_DEPARTMENT_OTHER): Payer: Self-pay | Admitting: General Surgery

## 2022-10-27 DIAGNOSIS — K603 Anal fistula: Secondary | ICD-10-CM | POA: Insufficient documentation

## 2022-10-27 DIAGNOSIS — J45909 Unspecified asthma, uncomplicated: Secondary | ICD-10-CM | POA: Insufficient documentation

## 2022-10-27 DIAGNOSIS — F1290 Cannabis use, unspecified, uncomplicated: Secondary | ICD-10-CM | POA: Diagnosis not present

## 2022-10-27 DIAGNOSIS — Z01818 Encounter for other preprocedural examination: Secondary | ICD-10-CM

## 2022-10-27 DIAGNOSIS — Z79899 Other long term (current) drug therapy: Secondary | ICD-10-CM | POA: Insufficient documentation

## 2022-10-27 DIAGNOSIS — K6289 Other specified diseases of anus and rectum: Secondary | ICD-10-CM | POA: Diagnosis present

## 2022-10-27 DIAGNOSIS — Z87891 Personal history of nicotine dependence: Secondary | ICD-10-CM | POA: Insufficient documentation

## 2022-10-27 DIAGNOSIS — Z7951 Long term (current) use of inhaled steroids: Secondary | ICD-10-CM | POA: Insufficient documentation

## 2022-10-27 DIAGNOSIS — K620 Anal polyp: Secondary | ICD-10-CM | POA: Diagnosis not present

## 2022-10-27 DIAGNOSIS — F319 Bipolar disorder, unspecified: Secondary | ICD-10-CM | POA: Diagnosis not present

## 2022-10-27 HISTORY — PX: ANAL FISTULOTOMY: SHX6423

## 2022-10-27 HISTORY — PX: RECTAL EXAM UNDER ANESTHESIA: SHX6399

## 2022-10-27 LAB — POCT PREGNANCY, URINE: Preg Test, Ur: NEGATIVE

## 2022-10-27 SURGERY — EXAM UNDER ANESTHESIA, RECTUM
Anesthesia: General

## 2022-10-27 MED ORDER — ROCURONIUM BROMIDE 10 MG/ML (PF) SYRINGE
PREFILLED_SYRINGE | INTRAVENOUS | Status: AC
  Filled 2022-10-27: qty 10

## 2022-10-27 MED ORDER — PROPOFOL 10 MG/ML IV BOLUS
INTRAVENOUS | Status: DC | PRN
  Administered 2022-10-27: 170 mg via INTRAVENOUS

## 2022-10-27 MED ORDER — ONDANSETRON HCL 4 MG/2ML IJ SOLN
4.0000 mg | Freq: Once | INTRAMUSCULAR | Status: AC | PRN
  Administered 2022-10-27: 4 mg via INTRAVENOUS

## 2022-10-27 MED ORDER — OXYCODONE HCL 5 MG PO TABS: 5.0000 mg | ORAL_TABLET | Freq: Once | ORAL | Status: DC | PRN

## 2022-10-27 MED ORDER — OXYCODONE HCL 5 MG PO TABS: 5.0000 mg | ORAL_TABLET | ORAL | Status: DC | PRN

## 2022-10-27 MED ORDER — TRAMADOL HCL 50 MG PO TABS: 50.0000 mg | ORAL_TABLET | Freq: Four times a day (QID) | ORAL | 0 refills | Status: DC | PRN

## 2022-10-27 MED ORDER — ACETAMINOPHEN 325 MG PO TABS: 650.0000 mg | ORAL_TABLET | ORAL | Status: DC | PRN

## 2022-10-27 MED ORDER — MIDAZOLAM HCL 5 MG/5ML IJ SOLN
INTRAMUSCULAR | Status: DC | PRN
  Administered 2022-10-27: 2 mg via INTRAVENOUS

## 2022-10-27 MED ORDER — HYDROMORPHONE HCL 1 MG/ML IJ SOLN: 0.2500 mg | INTRAMUSCULAR | Status: DC | PRN

## 2022-10-27 MED ORDER — ONDANSETRON HCL 4 MG/2ML IJ SOLN
INTRAMUSCULAR | Status: DC | PRN
  Administered 2022-10-27: 4 mg via INTRAVENOUS

## 2022-10-27 MED ORDER — GABAPENTIN 300 MG PO CAPS
300.0000 mg | ORAL_CAPSULE | ORAL | Status: AC
  Administered 2022-10-27: 300 mg via ORAL

## 2022-10-27 MED ORDER — LIDOCAINE 2% (20 MG/ML) 5 ML SYRINGE
INTRAMUSCULAR | Status: DC | PRN
  Administered 2022-10-27: 100 mg via INTRAVENOUS

## 2022-10-27 MED ORDER — OXYCODONE HCL 5 MG/5ML PO SOLN: 5.0000 mg | Freq: Once | ORAL | Status: DC | PRN

## 2022-10-27 MED ORDER — CELECOXIB 200 MG PO CAPS
200.0000 mg | ORAL_CAPSULE | ORAL | Status: AC
  Administered 2022-10-27: 200 mg via ORAL

## 2022-10-27 MED ORDER — 0.9 % SODIUM CHLORIDE (POUR BTL) OPTIME
TOPICAL | Status: DC | PRN
  Administered 2022-10-27: 500 mL

## 2022-10-27 MED ORDER — SODIUM CHLORIDE 0.9% FLUSH: 3.0000 mL | Freq: Two times a day (BID) | INTRAVENOUS | Status: DC

## 2022-10-27 MED ORDER — SUGAMMADEX SODIUM 200 MG/2ML IV SOLN
INTRAVENOUS | Status: DC | PRN
  Administered 2022-10-27: 200 mg via INTRAVENOUS

## 2022-10-27 MED ORDER — DEXAMETHASONE SODIUM PHOSPHATE 10 MG/ML IJ SOLN
INTRAMUSCULAR | Status: AC
  Filled 2022-10-27: qty 1

## 2022-10-27 MED ORDER — CELECOXIB 200 MG PO CAPS
ORAL_CAPSULE | ORAL | Status: AC
  Filled 2022-10-27: qty 1

## 2022-10-27 MED ORDER — ACETAMINOPHEN 500 MG PO TABS
ORAL_TABLET | ORAL | Status: AC
  Filled 2022-10-27: qty 2

## 2022-10-27 MED ORDER — SODIUM CHLORIDE 0.9 % IV SOLN: 250.0000 mL | INTRAVENOUS | Status: DC | PRN

## 2022-10-27 MED ORDER — BUPIVACAINE-EPINEPHRINE 0.25% -1:200000 IJ SOLN
INTRAMUSCULAR | Status: DC | PRN
  Administered 2022-10-27: 30 mL

## 2022-10-27 MED ORDER — DEXAMETHASONE SODIUM PHOSPHATE 10 MG/ML IJ SOLN
INTRAMUSCULAR | Status: DC | PRN
  Administered 2022-10-27: 5 mg via INTRAVENOUS

## 2022-10-27 MED ORDER — FENTANYL CITRATE (PF) 100 MCG/2ML IJ SOLN
INTRAMUSCULAR | Status: AC
  Filled 2022-10-27: qty 2

## 2022-10-27 MED ORDER — ONDANSETRON HCL 4 MG/2ML IJ SOLN
INTRAMUSCULAR | Status: AC
  Filled 2022-10-27: qty 2

## 2022-10-27 MED ORDER — ACETAMINOPHEN 325 MG RE SUPP: 650.0000 mg | RECTAL | Status: DC | PRN

## 2022-10-27 MED ORDER — GABAPENTIN 300 MG PO CAPS
ORAL_CAPSULE | ORAL | Status: AC
  Filled 2022-10-27: qty 1

## 2022-10-27 MED ORDER — KETOROLAC TROMETHAMINE 30 MG/ML IJ SOLN: INTRAMUSCULAR | Status: DC | PRN

## 2022-10-27 MED ORDER — ACETAMINOPHEN 500 MG PO TABS
1000.0000 mg | ORAL_TABLET | ORAL | Status: AC
  Administered 2022-10-27: 1000 mg via ORAL

## 2022-10-27 MED ORDER — MIDAZOLAM HCL 2 MG/2ML IJ SOLN
INTRAMUSCULAR | Status: AC
  Filled 2022-10-27: qty 2

## 2022-10-27 MED ORDER — LACTATED RINGERS IV SOLN: INTRAVENOUS | Status: DC

## 2022-10-27 MED ORDER — DEXMEDETOMIDINE HCL IN NACL 80 MCG/20ML IV SOLN
INTRAVENOUS | Status: DC | PRN
  Administered 2022-10-27: 8 ug via BUCCAL

## 2022-10-27 MED ORDER — SODIUM CHLORIDE 0.9% FLUSH: 3.0000 mL | INTRAVENOUS | Status: DC | PRN

## 2022-10-27 MED ORDER — ROCURONIUM BROMIDE 10 MG/ML (PF) SYRINGE
PREFILLED_SYRINGE | INTRAVENOUS | Status: DC | PRN
  Administered 2022-10-27: 50 mg via INTRAVENOUS

## 2022-10-27 MED ORDER — FENTANYL CITRATE (PF) 100 MCG/2ML IJ SOLN
INTRAMUSCULAR | Status: DC | PRN
  Administered 2022-10-27: 100 ug via INTRAVENOUS

## 2022-10-27 MED ORDER — KETOROLAC TROMETHAMINE 30 MG/ML IJ SOLN: 30.0000 mg | Freq: Once | INTRAMUSCULAR | Status: DC | PRN

## 2022-10-27 SURGICAL SUPPLY — 55 items
APL SKNCLS STERI-STRIP NONHPOA (GAUZE/BANDAGES/DRESSINGS) ×2
BENZOIN TINCTURE PRP APPL 2/3 (GAUZE/BANDAGES/DRESSINGS) ×2 IMPLANT
BLADE EXTENDED COATED 6.5IN (ELECTRODE) IMPLANT
BLADE SURG 10 STRL SS (BLADE) IMPLANT
BRIEF MESH DISP LRG (UNDERPADS AND DIAPERS) ×1 IMPLANT
COVER BACK TABLE 60X90IN (DRAPES) ×1 IMPLANT
COVER MAYO STAND STRL (DRAPES) ×1 IMPLANT
DRAPE HYSTEROSCOPY (MISCELLANEOUS) IMPLANT
DRAPE LAPAROTOMY 100X72 PEDS (DRAPES) ×1 IMPLANT
DRAPE SHEET LG 3/4 BI-LAMINATE (DRAPES) IMPLANT
DRAPE UTILITY XL STRL (DRAPES) ×1 IMPLANT
ELECT REM PT RETURN 9FT ADLT (ELECTROSURGICAL) ×1
ELECTRODE REM PT RTRN 9FT ADLT (ELECTROSURGICAL) ×1 IMPLANT
GAUZE 4X4 16PLY ~~LOC~~+RFID DBL (SPONGE) ×1 IMPLANT
GAUZE PAD ABD 8X10 STRL (GAUZE/BANDAGES/DRESSINGS) ×1 IMPLANT
GAUZE SPONGE 4X4 12PLY STRL (GAUZE/BANDAGES/DRESSINGS) ×1 IMPLANT
GAUZE SPONGE 4X4 12PLY STRL LF (GAUZE/BANDAGES/DRESSINGS) IMPLANT
GLOVE BIO SURGEON STRL SZ 6.5 (GLOVE) ×1 IMPLANT
GLOVE INDICATOR 6.5 STRL GRN (GLOVE) ×1 IMPLANT
GOWN STRL REUS W/TWL XL LVL3 (GOWN DISPOSABLE) ×1 IMPLANT
HYDROGEN PEROXIDE 16OZ (MISCELLANEOUS) ×1 IMPLANT
IV CATH 14GX2 1/4 (CATHETERS) ×1 IMPLANT
IV CATH 18G SAFETY (IV SOLUTION) ×1 IMPLANT
KIT SIGMOIDOSCOPE (SET/KITS/TRAYS/PACK) IMPLANT
KIT TURNOVER CYSTO (KITS) ×1 IMPLANT
LEGGING LITHOTOMY PAIR STRL (DRAPES) IMPLANT
LOOP VASCLR MAXI BLUE 18IN ST (MISCELLANEOUS) IMPLANT
LOOP VASCULAR MAXI 18 BLUE (MISCELLANEOUS)
LOOPS VASCLR MAXI BLUE 18IN ST (MISCELLANEOUS) IMPLANT
NDL HYPO 22X1.5 SAFETY MO (MISCELLANEOUS) ×1 IMPLANT
NEEDLE HYPO 22X1.5 SAFETY MO (MISCELLANEOUS) ×1 IMPLANT
NS IRRIG 500ML POUR BTL (IV SOLUTION) ×1 IMPLANT
PACK BASIN DAY SURGERY FS (CUSTOM PROCEDURE TRAY) ×1 IMPLANT
PAD ARMBOARD 7.5X6 YLW CONV (MISCELLANEOUS) IMPLANT
PENCIL SMOKE EVACUATOR (MISCELLANEOUS) ×1 IMPLANT
SLEEVE SCD COMPRESS KNEE MED (STOCKING) ×1 IMPLANT
SPIKE FLUID TRANSFER (MISCELLANEOUS) ×1 IMPLANT
SPONGE HEMORRHOID 8X3CM (HEMOSTASIS) IMPLANT
SPONGE SURGIFOAM ABS GEL 12-7 (HEMOSTASIS) IMPLANT
SUCTION FRAZIER HANDLE 10FR (MISCELLANEOUS)
SUCTION TUBE FRAZIER 10FR DISP (MISCELLANEOUS) IMPLANT
SUT CHROMIC 2 0 SH (SUTURE) IMPLANT
SUT CHROMIC 3 0 SH 27 (SUTURE) IMPLANT
SUT ETHIBOND 0 (SUTURE) IMPLANT
SUT VIC AB 2-0 SH 27 (SUTURE)
SUT VIC AB 2-0 SH 27XBRD (SUTURE) IMPLANT
SUT VIC AB 3-0 SH 18 (SUTURE) IMPLANT
SUT VIC AB 3-0 SH 27 (SUTURE)
SUT VIC AB 3-0 SH 27XBRD (SUTURE) IMPLANT
SYR CONTROL 10ML LL (SYRINGE) ×1 IMPLANT
TOWEL OR 17X24 6PK STRL BLUE (TOWEL DISPOSABLE) ×1 IMPLANT
TRAY DSU PREP LF (CUSTOM PROCEDURE TRAY) ×1 IMPLANT
TUBE CONNECTING 12X1/4 (SUCTIONS) ×1 IMPLANT
VASCULAR TIE MAXI BLUE 18IN ST (MISCELLANEOUS)
YANKAUER SUCT BULB TIP NO VENT (SUCTIONS) ×1 IMPLANT

## 2022-10-27 NOTE — Interval H&P Note (Signed)
History and Physical Interval Note:  10/27/2022 11:58 AM  Brandi Fleming  has presented today for surgery, with the diagnosis of ANAL PAIN.  The various methods of treatment have been discussed with the patient and family. After consideration of risks, benefits and other options for treatment, the patient has consented to  Procedure(s): RECTAL EXAM UNDER ANESTHESIA (N/A) POSSIBLE FISTULOTOMY OR SETON PLACEMENT (N/A) as a surgical intervention.  The patient's history has been reviewed, patient examined, no change in status, stable for surgery.  I have reviewed the patient's chart and labs.  Questions were answered to the patient's satisfaction.     Vanita Panda, MD  Colorectal and General Surgery [facility] Surgery

## 2022-10-27 NOTE — Anesthesia Postprocedure Evaluation (Signed)
Anesthesia Post Note  Patient: Brandi Fleming  Procedure(s) Performed: RECTAL EXAM UNDER ANESTHESIA POSSIBLE FISTULOTOMY SKIN TAG REMOVAL     Patient location during evaluation: PACU Anesthesia Type: General Level of consciousness: awake and alert Pain management: pain level controlled Vital Signs Assessment: post-procedure vital signs reviewed and stable Respiratory status: spontaneous breathing, nonlabored ventilation, respiratory function stable and patient connected to nasal cannula oxygen Cardiovascular status: blood pressure returned to baseline and stable Postop Assessment: no apparent nausea or vomiting Anesthetic complications: no  No notable events documented.  Last Vitals:  Vitals:   10/27/22 1421 10/27/22 1430  BP: (!) 102/59 101/60  Pulse: 65 70  Resp: 16 17  Temp:    SpO2: 96% 94%    Last Pain:  Vitals:   10/27/22 1430  TempSrc:   PainSc: 0-No pain                 Trevor Iha

## 2022-10-27 NOTE — Anesthesia Procedure Notes (Signed)
Procedure Name: Intubation Date/Time: 10/27/2022 12:59 PM  Performed by: Bishop Limbo, CRNAPre-anesthesia Checklist: Patient identified, Emergency Drugs available, Suction available and Patient being monitored Patient Re-evaluated:Patient Re-evaluated prior to induction Oxygen Delivery Method: Circle System Utilized Preoxygenation: Pre-oxygenation with 100% oxygen Induction Type: IV induction Ventilation: Mask ventilation without difficulty Laryngoscope Size: Mac and 3 Grade View: Grade I Tube type: Oral Tube size: 7.0 mm Number of attempts: 1 Airway Equipment and Method: Stylet and Bite block Placement Confirmation: ETT inserted through vocal cords under direct vision, positive ETCO2 and breath sounds checked- equal and bilateral Secured at: 22 cm Tube secured with: Tape Dental Injury: Teeth and Oropharynx as per pre-operative assessment

## 2022-10-27 NOTE — Discharge Instructions (Addendum)
Beginning the day after surgery:  You may sit in a tub of warm water 2-3 times a day to relieve discomfort.  Eat a regular diet high in fiber.  Avoid foods that give you constipation or diarrhea.  Avoid foods that are difficult to digest, such as seeds, nuts, corn or popcorn.  Do not go any longer than 2 days without a bowel movement.  You may take a dose of Milk of Magnesia if you become constipated.    Drink 6-8 glasses of water daily.  Walking is encouraged.  Avoid strenuous activity and heavy lifting for one month after surgery.    Call the office if you have any questions or concerns.  Call immediately if you develop:  Excessive rectal bleeding (more than a cup or passing large clots) Increased discomfort Fever greater than 100 F Difficulty urinating    Post Anesthesia Home Care Instructions  Activity: Get plenty of rest for the remainder of the day. A responsible individual must stay with you for 24 hours following the procedure.  For the next 24 hours, DO NOT: -Drive a car -Advertising copywriter -Drink alcoholic beverages -Take any medication unless instructed by your physician -Make any legal decisions or sign important papers.  Meals: Start with liquid foods such as gelatin or soup. Progress to regular foods as tolerated. Avoid greasy, spicy, heavy foods. If nausea and/or vomiting occur, drink only clear liquids until the nausea and/or vomiting subsides. Call your physician if vomiting continues.  Special Instructions/Symptoms: Your throat may feel dry or sore from the anesthesia or the breathing tube placed in your throat during surgery. If this causes discomfort, gargle with warm salt water. The discomfort should disappear within 24 hours.     No acetaminophen/Tylenol until after 6:30 pm today if needed. No ibuprofen, Advil, Aleve, Motrin, ketorolac, meloxicam, naproxen, or other NSAIDS until after 6:30 pm today if needed.

## 2022-10-27 NOTE — Op Note (Signed)
10/27/2022  1:29 PM  PATIENT:  Brandi Fleming  37 y.o. female  Patient Care Team: Ermelinda Das as PCP - General (Physician Assistant)  PRE-OPERATIVE DIAGNOSIS:  ANAL PAIN  POST-OPERATIVE DIAGNOSIS:  Anal fistula  PROCEDURE:   RECTAL EXAM UNDER ANESTHESIA, FISTULOTOMY, SKIN TAG REMOVAL    Surgeon(s): Romie Levee, MD  ASSISTANT: none   ANESTHESIA:   local and general  SPECIMEN:  Source of Specimen:  skin tag  DISPOSITION OF SPECIMEN:  PATHOLOGY  COUNTS:  YES  PLAN OF CARE: Discharge to home after PACU  PATIENT DISPOSITION:  PACU - hemodynamically stable.  INDICATION: 37 y.o. F with anal pain   OR FINDINGS: anterior anal fistula  DESCRIPTION: the patient was identified in the preoperative holding area and taken to the OR where they were laid on the operating room table.  General anesthesia was induced without difficulty. The patient was then positioned in prone jackknife position with buttocks gently taped apart.  The patient was then prepped and draped in usual sterile fashion.  SCDs were noted to be in place prior to the initiation of anesthesia. A surgical timeout was performed indicating the correct patient, procedure, positioning and need for preoperative antibiotics.  A rectal block was performed using Marcaine with epinephrine.    I began with a digital rectal exam.  There was sphincter hypertension.  Sphincter hypertension was carefully overcome with dilation and pressure.  A small Hill-Ferguson scope was used to evaluate the anal canal.  Patient was noted to have grade 2 hemorrhoids, noninflamed, at all 3 positions.  A small punctate loop lesion was noted at anterior midline.  A fistula probe was used to evaluate for fistula.  This exited into the anal canal with minimal internal sphincter involvement.  A fistulotomy was performed.  There was a large overlying skin tag that would prohibit healing.  This was excised.  Skin was then closed using  interrupted 3-0 chromic suture.  3-0 chromic suture was also used to marsupialize the fistula tract.  Additional Marcaine was placed around the incision site.  The patient was then awakened from anesthesia and sent to the post anesthesia care unit in stable condition.  All counts were correct per operating room staff.  Vanita Panda, MD  Colorectal and General Surgery [facility] Surgery

## 2022-10-27 NOTE — Transfer of Care (Signed)
Immediate Anesthesia Transfer of Care Note  Patient: Denice Bors  Procedure(s) Performed: RECTAL EXAM UNDER ANESTHESIA POSSIBLE FISTULOTOMY SKIN TAG REMOVAL  Patient Location: PACU  Anesthesia Type:General  Level of Consciousness: drowsy, patient cooperative, and responds to stimulation  Airway & Oxygen Therapy: Patient Spontanous Breathing  Post-op Assessment: Report given to RN and Post -op Vital signs reviewed and stable  Post vital signs: Reviewed and stable  Last Vitals:  Vitals Value Taken Time  BP    Temp    Pulse 73 10/27/22 1341  Resp 24 10/27/22 1341  SpO2 94 % 10/27/22 1341  Vitals shown include unvalidated device data.  Last Pain:  Vitals:   10/27/22 1233  TempSrc: Oral  PainSc: 4       Patients Stated Pain Goal: 4 (10/27/22 1233)  Complications: No notable events documented.

## 2022-10-28 ENCOUNTER — Encounter (HOSPITAL_BASED_OUTPATIENT_CLINIC_OR_DEPARTMENT_OTHER): Payer: Self-pay | Admitting: General Surgery

## 2022-10-28 LAB — SURGICAL PATHOLOGY

## 2022-10-28 NOTE — Progress Notes (Signed)
Patient discharge follow-up telephone call completed.  Patient c/o feeling a flap in the back of her throat, it is causing discomfort and she is concerned.  RN will speak with Anesthesia and have him follow up with patient.

## 2022-10-29 NOTE — Addendum Note (Signed)
Addendum  created 10/29/22 1207 by Trevor Iha, MD   Intraprocedure Event edited

## 2022-10-29 NOTE — Addendum Note (Signed)
Addendum  created 10/29/22 1122 by Trevor Iha, MD   Intraprocedure Event edited

## 2022-10-30 ENCOUNTER — Emergency Department (HOSPITAL_BASED_OUTPATIENT_CLINIC_OR_DEPARTMENT_OTHER): Payer: Medicaid Other

## 2022-10-30 ENCOUNTER — Emergency Department (HOSPITAL_BASED_OUTPATIENT_CLINIC_OR_DEPARTMENT_OTHER)
Admission: EM | Admit: 2022-10-30 | Discharge: 2022-10-31 | Disposition: A | Discharge status: Home / Self Care | Payer: Medicaid Other | Attending: Emergency Medicine | Admitting: Emergency Medicine

## 2022-10-30 ENCOUNTER — Other Ambulatory Visit: Payer: Self-pay

## 2022-10-30 ENCOUNTER — Encounter (HOSPITAL_BASED_OUTPATIENT_CLINIC_OR_DEPARTMENT_OTHER): Payer: Self-pay

## 2022-10-30 DIAGNOSIS — K5900 Constipation, unspecified: Secondary | ICD-10-CM | POA: Diagnosis not present

## 2022-10-30 DIAGNOSIS — K6289 Other specified diseases of anus and rectum: Secondary | ICD-10-CM | POA: Diagnosis present

## 2022-10-30 DIAGNOSIS — G8918 Other acute postprocedural pain: Secondary | ICD-10-CM

## 2022-10-30 DIAGNOSIS — J45909 Unspecified asthma, uncomplicated: Secondary | ICD-10-CM | POA: Diagnosis not present

## 2022-10-30 LAB — HCG, QUANTITATIVE, PREGNANCY: hCG, Beta Chain, Quant, S: <1 m[IU]/mL (ref <5)

## 2022-10-30 LAB — CBC
HCT: 32.7 % — ABNORMAL LOW (ref 36.0–46.0)
Hemoglobin: 10.7 g/dL — ABNORMAL LOW (ref 12.0–15.0)
MCH: 29.6 pg (ref 26.0–34.0)
MCHC: 32.7 g/dL (ref 30.0–36.0)
MCV: 90.3 fL (ref 80.0–100.0)
Platelets: 320 10*3/uL (ref 150–400)
RBC: 3.62 MIL/uL — ABNORMAL LOW (ref 3.87–5.11)
RDW: 13.1 % (ref 11.5–15.5)
WBC: 7.9 10*3/uL (ref 4.0–10.5)
nRBC: 0 % (ref 0.0–0.2)

## 2022-10-30 LAB — BASIC METABOLIC PANEL
Anion gap: 7 (ref 5–15)
BUN: 11 mg/dL (ref 6–20)
CO2: 26 mmol/L (ref 22–32)
Calcium: 8.8 mg/dL — ABNORMAL LOW (ref 8.9–10.3)
Chloride: 105 mmol/L (ref 98–111)
Creatinine, Ser: 0.58 mg/dL (ref 0.44–1.00)
GFR, Estimated: >60 mL/min (ref >60)
Glucose, Bld: 109 mg/dL — ABNORMAL HIGH (ref 70–99)
Potassium: 3.6 mmol/L (ref 3.5–5.1)
Sodium: 138 mmol/L (ref 135–145)

## 2022-10-30 MED ORDER — DOCUSATE SODIUM 100 MG PO CAPS: 100.0000 mg | ORAL_CAPSULE | Freq: Two times a day (BID) | ORAL | 0 refills | Status: AC

## 2022-10-30 MED ORDER — OXYCODONE-ACETAMINOPHEN 5-325 MG PO TABS: 1.0000 | ORAL_TABLET | Freq: Four times a day (QID) | ORAL | 0 refills | Status: DC | PRN

## 2022-10-30 MED ORDER — MORPHINE SULFATE (PF) 4 MG/ML IV SOLN
4.0000 mg | Freq: Once | INTRAVENOUS | Status: AC
  Administered 2022-10-30: 4 mg via INTRAVENOUS
  Filled 2022-10-30: qty 1

## 2022-10-30 MED ORDER — ONDANSETRON HCL 4 MG/2ML IJ SOLN
4.0000 mg | Freq: Once | INTRAMUSCULAR | Status: AC
  Administered 2022-10-30: 4 mg via INTRAVENOUS
  Filled 2022-10-30: qty 2

## 2022-10-30 MED ORDER — IOHEXOL 300 MG/ML  SOLN
100.0000 mL | Freq: Once | INTRAMUSCULAR | Status: AC | PRN
  Administered 2022-10-30: 80 mL via INTRAVENOUS

## 2022-10-30 NOTE — ED Triage Notes (Signed)
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   Today began passing large blood clots and increased amount of pain.   Encouraged to ER for eval by surgery.

## 2022-10-30 NOTE — Discharge Instructions (Addendum)
You were seen in the emergency department today for ongoing rectal bleeding and pain after your procedure.  Your blood work looked reassuring today.  Your CT scan did show a large stool ball.  I think that this could be pressing on the area in which you had surgery, causing you more pain.  I am prescribing you a short course of pain medication for the next few days, and I recommend taking a stool softener.  You can take Colace (docusate sodium) 100 mg twice a day.  Make sure that you are staying fairly well-hydrated with this.  It is incredibly important you follow-up with surgery office next week.

## 2022-10-30 NOTE — ED Provider Notes (Signed)
EMERGENCY DEPARTMENT AT [facility] Provider Note   CSN: [id number] Arrival date & time: 10/30/22  2203     History  Chief Complaint  Patient presents with   Rectal Bleeding    Brandi Fleming is a 37 y.o. female with history of bipolar 1 disorder, asthma, recent rectal surgery who presents the emergency department complaining of rectal pain and passing blood clots in her stool.  Patient underwent surgery by Dr. Maisie Fus including fistulotomy and skin tag removal.  She was discharged with short prescription for tramadol.  She states that she had been taking this but has not run out.  Her pain has continued, and since she ran out of the medicine it is getting worse.  She started passing blood clots while trying to have bowel movement.  She states that she called the surgeon's office and the provider on-call could not prescribe her any more medicine without seeing her in person.  They were unable to see her because it is the weekend, and recommended if she was in that much pain to go to the ER for evaluation.  She denies any fever.  She is tearful, and complaining of significant pain and discomfort.  Has also been trying sitz bath's without relief.   Rectal Bleeding      Home Medications Prior to Admission medications   Medication Sig Start Date End Date Taking? Authorizing Provider  albuterol (VENTOLIN HFA) 108 (90 Base) MCG/ACT inhaler Inhale into the lungs every 6 (six) hours as needed for wheezing or shortness of breath.    [provider]  amoxicillin-clavulanate (AUGMENTIN) 875-125 MG tablet Take 1 tablet by mouth every 12 (twelve) hours. 10/21/22   Rexford Maus, DO  amphetamine-dextroamphetamine (ADDERALL XR) 10 MG 24 hr capsule Take 10 mg by mouth daily. Patient not taking: Reported on 10/27/2022 07/01/22   [provider]  amphetamine-dextroamphetamine (ADDERALL XR) 15 MG 24 hr capsule Take 15 mg by mouth daily. 07/19/22   [provider]  amphetamine-dextroamphetamine (ADDERALL XR) 20 MG 24 hr capsule Take 20 mg by mouth daily as needed.    [provider]  amphetamine-dextroamphetamine (ADDERALL) 5 MG tablet Take 5 mg by mouth. Patient not taking: Reported on 10/27/2022 06/28/22   [provider]  bisacodyl (DULCOLAX) 10 MG suppository Place 1 suppository (10 mg total) rectally as needed for moderate constipation. Patient not taking: Reported on 10/27/2022 09/30/22   Blue, Soijett A, PA-C  clonazePAM (KLONOPIN) 1 MG tablet Take 1 mg by mouth 2 (two) times daily as needed. 09/18/22   [provider]  DULoxetine (CYMBALTA) 60 MG capsule Take 60 mg by mouth daily. Patient not taking: Reported on 10/27/2022    [provider]  lidocaine (XYLOCAINE) 2 % solution Use as directed 15 mLs in the mouth or throat every 4 (four) hours as needed for mouth pain. Patient not taking: Reported on 10/27/2022 01/29/21   Pricilla Loveless, MD  ondansetron (ZOFRAN) 4 MG tablet Take 1 tablet (4 mg total) by mouth every 6 (six) hours. Patient not taking: Reported on 10/27/2022 10/21/22   Elayne Snare K, DO  QUEtiapine (SEROQUEL XR) 400 MG 24 hr tablet Take 400 mg by mouth every evening. Patient not taking: Reported on 10/27/2022 07/19/22   [provider]  REXULTI 1 MG TABS tablet Take 1 mg by mouth at bedtime. Patient not taking: Reported on 10/27/2022 09/20/22   [provider]  senna-docusate (SENOKOT-S) 8.6-50 MG tablet Take 1 tablet by mouth  daily. 10/21/22   Elayne Snare K, DO  traMADol (ULTRAM) 50 MG tablet Take 1-2 tablets (50-100 mg total) by mouth every 6 (six) hours as needed. 10/27/22   Romie Levee, MD      Allergies    Vicodin [hydrocodone-acetaminophen]    Review of Systems   Review of Systems  Gastrointestinal:  Positive for anal bleeding and hematochezia.       Rectal pain  All other systems reviewed and are negative.   Physical Exam Updated Vital Signs BP  119/84   Pulse 85   Temp (!) 97.5 F (36.4 C)   Resp 20   Ht  (1.575 m)   Wt 87.5 kg   LMP 10/25/2022 (Exact Date) Comment: 0n period now  SpO2 100%   BMI 35.30 kg/m  Physical Exam Vitals and nursing note reviewed.  Constitutional:      Appearance: Normal appearance.  HENT:     Head: Normocephalic and atraumatic.  Eyes:     Conjunctiva/sclera: Conjunctivae normal.  Pulmonary:     Effort: Pulmonary effort is normal. No respiratory distress.  Abdominal:     Palpations: Abdomen is soft.     Tenderness: There is no abdominal tenderness.  Genitourinary:    Comments: Viewed rectum externally. Visualized multiple external hemorrhoids/skin tags without obvious thrombosis. Small amount of bright red blood visualized.  Skin:    General: Skin is warm and dry.  Neurological:     Mental Status: She is alert.  Psychiatric:        Mood and Affect: Mood normal.        Behavior: Behavior normal.     ED Results / Procedures / Treatments   Labs (all labs ordered are listed, but only abnormal results are displayed) Labs Reviewed  CBC - Abnormal; Notable for the following components:      Result Value   RBC 3.62 (*)    Hemoglobin 10.7 (*)    HCT 32.7 (*)    All other components within normal limits  BASIC METABOLIC PANEL - Abnormal; Notable for the following components:   Glucose, Bld 109 (*)    Calcium 8.8 (*)    All other components within normal limits  HCG, QUANTITATIVE, PREGNANCY    EKG None  Radiology No results found.  Procedures Procedures    Medications Ordered in ED Medications  ondansetron (ZOFRAN) injection 4 mg (4 mg Intravenous Given 10/30/22 2323)  morphine (PF) 4 MG/ML injection 4 mg (4 mg Intravenous Given 10/30/22 2323)  iohexol (OMNIPAQUE) 300 MG/ML solution 100 mL (80 mLs Intravenous Contrast Given 10/30/22 2330)    ED Course/ Medical Decision Making/ A&P                             Medical Decision Making Amount and/or Complexity of Data  Reviewed Labs: ordered. Radiology: ordered.  Risk Prescription drug management.   This patient is a 37 y.o. female  who presents to the ED for concern of rectal pain and bleeding. S/p rectal surgery three days ago.   Differential diagnoses prior to evaluation: The emergent differential diagnosis includes, but is not limited to,  rectal/anal abscess, fistula, hemorrhoids, constipation, proctitis. This is not an exhaustive differential.   Past Medical History / Co-morbidities: bipolar 1 disorder, asthma  Additional history: Chart reviewed. Pertinent results include: Underwent rectal exam under anesthesia and fistulotomy by Dr. Maisie Fus with [facility] surgery on 4/17.  Fistula was noted exam the anal  canal with minimal internal sphincter involvement.  Physical Exam: Physical exam performed. The pertinent findings include: Patient tearful on exam.  Abdomen soft and nontender.  Visualized rectum externally, with evidence of hemorrhoids/skin tags and small amount of red blood.  No obvious thrombosis of hemorrhoids.  Internal exam was deferred.  Lab Tests/Imaging studies: I personally interpreted labs/imaging and the pertinent results include: Hemoglobin 10.7, down slightly from preoperative labs, but overall stable.  BMP unremarkable.  Negative pregnancy.  CT pelvis pending at time of shift change. Based on my interpretation, patient has large stool ball, no obvious abscess or fluid collection.   Medications: I ordered medication including morphine and Zofran.  I have reviewed the patients home medicines and have made adjustments as needed.   Disposition: Patient discussed and care transferred to attending physician Dr. Bernette Mayers at shift change. Please see his/her note for further details regarding further ED course and disposition. Plan at time of handoff is follow up on CT imaging. If imaging without emergent findings, plan to d/c to home with pain medication and stool softeners.  Recommend follow up with general surgery next week.   Final Clinical Impression(s) / ED Diagnoses Final diagnoses:  Rectal pain  Constipation, unspecified constipation type    Rx / DC Orders ED Discharge Orders     None      Portions of this report may have been transcribed using voice recognition software. Every effort was made to ensure accuracy; however, inadvertent computerized transcription errors may be present.    Jeanella Flattery 10/30/22 2359    Maia Plan, MD 10/31/22 1126

## 2022-10-31 MED ORDER — LIDOCAINE HCL URETHRAL/MUCOSAL 2 % EX GEL
1.0000 | Freq: Once | CUTANEOUS | Status: AC
  Administered 2022-10-31: 1
  Filled 2022-10-31: qty 11

## 2022-10-31 NOTE — ED Provider Notes (Signed)
Care of the patient assumed at the change of shift. Recent surgery for anal fistula/skin tag, now with continued rectal pain and unable to have BM. Noticed some blood with straining earlier. CT pending at shift change.  Physical Exam  BP 119/84   Pulse 85   Temp (!) 97.5 F (36.4 C)   Resp 20   Ht  (1.575 m)   Wt 87.5 kg   LMP 10/25/2022 (Exact Date) Comment: 0n period now  SpO2 100%   BMI 35.30 kg/m   Physical Exam  Procedures  Procedures  ED Course / MDM   Clinical Course as of 10/31/22 0128  Sun Oct 31, 2022  0124 I personally viewed the images from radiology studies and agree with radiologist interpretation: CT neg for post-op infection. There is a rectal stool ball likely contributing to her discomfort.  [CS]  0125 I spoke with Dr. Bedelia Person, General Surgery, who advises no contraindication to attempts at fecal disimpaction.  [CS]  0125 Patient given lidocaine jelly to attempt to improve discomfort but still unable to tolerate digital exam or disimpaction. Advised she will need aggressive bowel regiment including miralax 3-4 times per day, magnesium citrate, etc to help with her symptoms. Also advised that opiate medications would be counter-productive in this situation. Recommend close outpatient follow up with Gen Surg if not improving. RTED for any other concerns.  [CS]    Clinical Course User Index [CS] Pollyann Savoy, MD   Medical Decision Making Amount and/or Complexity of Data Reviewed Labs: ordered. Radiology: ordered.  Risk OTC drugs. Prescription drug management.          Pollyann Savoy, MD 10/31/22 [phone]

## 2022-10-31 NOTE — ED Notes (Signed)
Pt discharged to home, NAD noted
